# Patient Record
Sex: Male | Born: 1965 | Race: White | Hispanic: No | Marital: Married | Smoking: Former smoker
Health system: Southern US, Community
[De-identification: ages and names within clinical notes are randomized; demographics above are authoritative.]

## PROBLEM LIST (undated history)

## (undated) DIAGNOSIS — E119 Type 2 diabetes mellitus without complications: Secondary | ICD-10-CM

## (undated) DIAGNOSIS — K219 Gastro-esophageal reflux disease without esophagitis: Secondary | ICD-10-CM

## (undated) DIAGNOSIS — I10 Essential (primary) hypertension: Secondary | ICD-10-CM

## (undated) DIAGNOSIS — G4730 Sleep apnea, unspecified: Secondary | ICD-10-CM

## (undated) DIAGNOSIS — E785 Hyperlipidemia, unspecified: Secondary | ICD-10-CM

## (undated) HISTORY — PX: CHOLECYSTECTOMY: SHX55

## (undated) HISTORY — DX: Sleep apnea, unspecified: G47.30

## (undated) HISTORY — PX: HERNIA REPAIR: SHX51

---

## 2007-12-19 ENCOUNTER — Ambulatory Visit: Payer: Self-pay | Admitting: Family Medicine

## 2010-05-04 ENCOUNTER — Ambulatory Visit: Payer: Self-pay | Admitting: Internal Medicine

## 2011-06-17 DIAGNOSIS — K219 Gastro-esophageal reflux disease without esophagitis: Secondary | ICD-10-CM | POA: Insufficient documentation

## 2011-06-17 DIAGNOSIS — F4310 Post-traumatic stress disorder, unspecified: Secondary | ICD-10-CM | POA: Insufficient documentation

## 2013-07-06 DIAGNOSIS — E7800 Pure hypercholesterolemia, unspecified: Secondary | ICD-10-CM | POA: Insufficient documentation

## 2013-07-06 DIAGNOSIS — E119 Type 2 diabetes mellitus without complications: Secondary | ICD-10-CM | POA: Insufficient documentation

## 2013-07-28 ENCOUNTER — Ambulatory Visit: Payer: Self-pay | Admitting: Family Medicine

## 2013-08-03 ENCOUNTER — Ambulatory Visit: Payer: Self-pay | Admitting: Family Medicine

## 2013-10-10 ENCOUNTER — Ambulatory Visit: Payer: Self-pay | Admitting: Internal Medicine

## 2013-11-02 ENCOUNTER — Ambulatory Visit: Payer: Self-pay | Admitting: Internal Medicine

## 2013-12-03 ENCOUNTER — Ambulatory Visit: Payer: Self-pay | Admitting: Internal Medicine

## 2014-05-30 DIAGNOSIS — C439 Malignant melanoma of skin, unspecified: Secondary | ICD-10-CM | POA: Insufficient documentation

## 2015-06-04 ENCOUNTER — Telehealth: Payer: Self-pay | Admitting: Gastroenterology

## 2015-06-04 NOTE — Telephone Encounter (Signed)
colonoscopy

## 2015-06-15 ENCOUNTER — Other Ambulatory Visit: Payer: Self-pay

## 2015-06-15 DIAGNOSIS — I10 Essential (primary) hypertension: Secondary | ICD-10-CM | POA: Insufficient documentation

## 2015-06-15 NOTE — Telephone Encounter (Signed)
Tried contacting pt and phone number had been disconnected. No emergency number to call pt. Mailed letter requesting pt to call to schedule colonoscopy.

## 2017-03-24 ENCOUNTER — Telehealth: Payer: Self-pay | Admitting: Gastroenterology

## 2017-03-24 NOTE — Telephone Encounter (Signed)
Patient needs to schedule his colonoscopy. He has been called. Call home then cell please

## 2017-03-31 ENCOUNTER — Other Ambulatory Visit: Payer: Self-pay

## 2017-03-31 DIAGNOSIS — Z1211 Encounter for screening for malignant neoplasm of colon: Secondary | ICD-10-CM

## 2017-03-31 DIAGNOSIS — Z1212 Encounter for screening for malignant neoplasm of rectum: Principal | ICD-10-CM

## 2017-03-31 NOTE — Progress Notes (Signed)
Gastroenterology Pre-Procedure Review  Request Date: 12/10 Requesting Physician: Dr. Allen Norris  PATIENT REVIEW QUESTIONS: The patient responded to the following health history questions as indicated:    1. Are you having any GI issues? no 2. Do you have a personal history of Polyps? no 3. Do you have a family history of Colon Cancer or Polyps? no 4. Diabetes Mellitus? yes (Type II) 5. Joint replacements in the past 12 months?no 6. Major health problems in the past 3 months?no 7. Any artificial heart valves, MVP, or defibrillator?no    MEDICATIONS & ALLERGIES:    Patient reports the following regarding taking any anticoagulation/antiplatelet therapy:   Plavix, Coumadin, Eliquis, Xarelto, Lovenox, Pradaxa, Brilinta, or Effient? no Aspirin? yes (81mg )  Patient confirms/reports the following medications:  Current Outpatient Medications  Medication Sig Dispense Refill  . atorvastatin (LIPITOR) 40 MG tablet TK 1 T PO QD  3  . lisinopril (PRINIVIL,ZESTRIL) 10 MG tablet   11  . metFORMIN (GLUCOPHAGE) 500 MG tablet TK 1 T PO BID WITH MEALS  2  . Multiple Vitamin (MULTIVITAMIN) capsule Take by mouth.    . pantoprazole (PROTONIX) 40 MG tablet TK 1 T PO  DAILY  3  . PARoxetine (PAXIL) 20 MG tablet TK 1 T PO QD  3   No current facility-administered medications for this visit.     Patient confirms/reports the following allergies:  Not on File  No orders of the defined types were placed in this encounter.   AUTHORIZATION INFORMATION Primary Insurance: 1D#: Group #:  Secondary Insurance: 1D#: Group #:  SCHEDULE INFORMATION: Date: 12/10 Time: Location: [place]

## 2017-04-07 ENCOUNTER — Encounter: Payer: Self-pay | Admitting: *Deleted

## 2017-04-07 ENCOUNTER — Other Ambulatory Visit: Payer: Self-pay

## 2017-04-17 NOTE — Discharge Instructions (Signed)
General Anesthesia, Adult, Care After °These instructions provide you with information about caring for yourself after your procedure. Your health care provider may also give you more specific instructions. Your treatment has been planned according to current medical practices, but problems sometimes occur. Call your health care provider if you have any problems or questions after your procedure. °What can I expect after the procedure? °After the procedure, it is common to have: °· Vomiting. °· A sore throat. °· Mental slowness. ° °It is common to feel: °· Nauseous. °· Cold or shivery. °· Sleepy. °· Tired. °· Sore or achy, even in parts of your body where you did not have surgery. ° °Follow these instructions at home: °For at least 24 hours after the procedure: °· Do not: °? Participate in activities where you could fall or become injured. °? Drive. °? Use heavy machinery. °? Drink alcohol. °? Take sleeping pills or medicines that cause drowsiness. °? Make important decisions or sign legal documents. °? Take care of children on your own. °· Rest. °Eating and drinking °· If you vomit, drink water, juice, or soup when you can drink without vomiting. °· Drink enough fluid to keep your urine clear or pale yellow. °· Make sure you have little or no nausea before eating solid foods. °· Follow the diet recommended by your health care provider. °General instructions °· Have a responsible adult stay with you until you are awake and alert. °· Return to your normal activities as told by your health care provider. Ask your health care provider what activities are safe for you. °· Take over-the-counter and prescription medicines only as told by your health care provider. °· If you smoke, do not smoke without supervision. °· Keep all follow-up visits as told by your health care provider. This is important. °Contact a health care provider if: °· You continue to have nausea or vomiting at home, and medicines are not helpful. °· You  cannot drink fluids or start eating again. °· You cannot urinate after 8-12 hours. °· You develop a skin rash. °· You have fever. °· You have increasing redness at the site of your procedure. °Get help right away if: °· You have difficulty breathing. °· You have chest pain. °· You have unexpected bleeding. °· You feel that you are having a life-threatening or urgent problem. °This information is not intended to replace advice given to you by your health care provider. Make sure you discuss any questions you have with your health care provider. °Document Released: 07/28/2000 Document Revised: 09/24/2015 Document Reviewed: 04/05/2015 °Elsevier Interactive Patient Education © 2018 [employer] Inc[employer] ° °

## 2017-04-20 ENCOUNTER — Ambulatory Visit: Payer: BC Managed Care – PPO | Admitting: Anesthesiology

## 2017-04-20 ENCOUNTER — Ambulatory Visit
Admission: RE | Disposition: A | Discharge status: Home / Self Care | Payer: Self-pay | Source: Ambulatory Visit | Attending: Gastroenterology

## 2017-04-20 ENCOUNTER — Ambulatory Visit
Admission: RE | Admit: 2017-04-20 | Discharge: 2017-04-20 | Disposition: A | Discharge status: Home / Self Care | Payer: BC Managed Care – PPO | Source: Ambulatory Visit | Attending: Gastroenterology | Admitting: Gastroenterology

## 2017-04-20 DIAGNOSIS — Z1211 Encounter for screening for malignant neoplasm of colon: Secondary | ICD-10-CM

## 2017-04-20 DIAGNOSIS — E119 Type 2 diabetes mellitus without complications: Secondary | ICD-10-CM | POA: Insufficient documentation

## 2017-04-20 DIAGNOSIS — Z87891 Personal history of nicotine dependence: Secondary | ICD-10-CM | POA: Diagnosis not present

## 2017-04-20 DIAGNOSIS — Z7982 Long term (current) use of aspirin: Secondary | ICD-10-CM | POA: Insufficient documentation

## 2017-04-20 DIAGNOSIS — K219 Gastro-esophageal reflux disease without esophagitis: Secondary | ICD-10-CM | POA: Insufficient documentation

## 2017-04-20 DIAGNOSIS — Z79899 Other long term (current) drug therapy: Secondary | ICD-10-CM | POA: Insufficient documentation

## 2017-04-20 DIAGNOSIS — Z7984 Long term (current) use of oral hypoglycemic drugs: Secondary | ICD-10-CM | POA: Diagnosis not present

## 2017-04-20 DIAGNOSIS — K641 Second degree hemorrhoids: Secondary | ICD-10-CM | POA: Insufficient documentation

## 2017-04-20 DIAGNOSIS — I10 Essential (primary) hypertension: Secondary | ICD-10-CM | POA: Insufficient documentation

## 2017-04-20 DIAGNOSIS — E785 Hyperlipidemia, unspecified: Secondary | ICD-10-CM | POA: Diagnosis not present

## 2017-04-20 DIAGNOSIS — Z6836 Body mass index (BMI) 36.0-36.9, adult: Secondary | ICD-10-CM | POA: Insufficient documentation

## 2017-04-20 HISTORY — DX: Essential (primary) hypertension: I10

## 2017-04-20 HISTORY — DX: Gastro-esophageal reflux disease without esophagitis: K21.9

## 2017-04-20 HISTORY — DX: Hyperlipidemia, unspecified: E78.5

## 2017-04-20 HISTORY — DX: Type 2 diabetes mellitus without complications: E11.9

## 2017-04-20 HISTORY — PX: COLONOSCOPY WITH PROPOFOL: SHX5780

## 2017-04-20 LAB — GLUCOSE, CAPILLARY
GLUCOSE-CAPILLARY: 123 mg/dL — AB (ref 65–99)
GLUCOSE-CAPILLARY: 113 mg/dL — AB (ref 65–99)

## 2017-04-20 SURGERY — COLONOSCOPY WITH PROPOFOL
Anesthesia: General | Wound class: Contaminated

## 2017-04-20 MED ORDER — PROPOFOL 10 MG/ML IV BOLUS
INTRAVENOUS | Status: DC | PRN
  Administered 2017-04-20 (×3): 20 mg via INTRAVENOUS
  Administered 2017-04-20: 80 mg via INTRAVENOUS
  Administered 2017-04-20 (×4): 20 mg via INTRAVENOUS

## 2017-04-20 MED ORDER — LACTATED RINGERS IV SOLN
INTRAVENOUS | Status: DC
  Administered 2017-04-20: 08:00:00 via INTRAVENOUS

## 2017-04-20 MED ORDER — OXYCODONE HCL 5 MG PO TABS: 5.0000 mg | ORAL_TABLET | Freq: Once | ORAL | Status: DC | PRN

## 2017-04-20 MED ORDER — STERILE WATER FOR IRRIGATION IR SOLN
Status: DC | PRN
  Administered 2017-04-20: 08:00:00

## 2017-04-20 MED ORDER — LIDOCAINE HCL (CARDIAC) 20 MG/ML IV SOLN
INTRAVENOUS | Status: DC | PRN
  Administered 2017-04-20: 40 mg via INTRAVENOUS

## 2017-04-20 MED ORDER — LACTATED RINGERS IV SOLN: 500.0000 mL | INTRAVENOUS | Status: DC

## 2017-04-20 MED ORDER — OXYCODONE HCL 5 MG/5ML PO SOLN: 5.0000 mg | Freq: Once | ORAL | Status: DC | PRN

## 2017-04-20 SURGICAL SUPPLY — 23 items

## 2017-04-20 NOTE — Transfer of Care (Signed)
Immediate Anesthesia Transfer of Care Note  Patient: Phillip Alexander  Procedure(s) Performed: COLONOSCOPY WITH PROPOFOL (N/A )  Patient Location: PACU  Anesthesia Type: General  Level of Consciousness: awake, alert  and patient cooperative  Airway and Oxygen Therapy: Patient Spontanous Breathing and Patient connected to supplemental oxygen  Post-op Assessment: Post-op Vital signs reviewed, Patient's Cardiovascular Status Stable, Respiratory Function Stable, Patent Airway and No signs of Nausea or vomiting  Post-op Vital Signs: Reviewed and stable  Complications: No apparent anesthesia complications

## 2017-04-20 NOTE — H&P (Signed)
   Lucilla Lame, MD [address] [place], [place] [address] Phone: [phone] Fax : [phone]  Primary Care Physician:  Hortencia Pilar, MD Primary Gastroenterologist:  Dr. Allen Norris  Pre-Procedure History & Physical: HPI:  Phillip Alexander is a 51 y.o. male is here for a screening colonoscopy.   Past Medical History:  Diagnosis Date  . Diabetes mellitus, type 2 ([place])   . GERD (gastroesophageal reflux disease)   . Hyperlipidemia   . Hypertension     Past Surgical History:  Procedure Laterality Date  . CHOLECYSTECTOMY    . HERNIA REPAIR      Prior to Admission medications   Medication Sig Start Date End Date Taking? Authorizing Provider  aspirin 81 MG tablet Take 81 mg by mouth daily.   Yes [provider]  atorvastatin (LIPITOR) 40 MG tablet TK 1 T PO QD 03/15/15  Yes [provider]  lisinopril (PRINIVIL,ZESTRIL) 10 MG tablet  05/31/15  Yes [provider]  metFORMIN (GLUCOPHAGE) 500 MG tablet TK 1 T PO BID WITH MEALS 03/15/15  Yes [provider]  Multiple Vitamin (MULTIVITAMIN) capsule Take by mouth.   Yes [provider]  pantoprazole (PROTONIX) 40 MG tablet TK 1 T PO  DAILY 03/27/15  Yes [provider]  PARoxetine (PAXIL) 20 MG tablet TK 1 T PO QD 03/15/15  Yes [provider]    Allergies as of 03/31/2017  . (Not on File)    History reviewed. No pertinent family history.  Social History   Socioeconomic History  . Marital status: Married    Spouse name: Not on file  . Number of children: Not on file  . Years of education: Not on file  . Highest education level: Not on file  Social Needs  . Financial resource strain: Not on file  . Food insecurity - worry: Not on file  . Food insecurity - inability: Not on file  . Transportation needs - medical: Not on file  . Transportation needs - non-medical: Not on file  Occupational History  . Not on file  Tobacco Use  . Smoking status: Former  Smoker    Types: Cigarettes    Last attempt to quit: 2007    Years since quitting: 11.9  . Smokeless tobacco: Never Used  Substance and Sexual Activity  . Alcohol use: Yes    Comment: rare - Holidays  . Drug use: Not on file  . Sexual activity: Not on file  Other Topics Concern  . Not on file  Social History Narrative  . Not on file    Review of Systems: See HPI, otherwise negative ROS  Physical Exam: BP 132/86   Pulse 96   Ht 5\' 10"  (1.778 m)   Wt 253 lb (114.8 kg)   SpO2 98%   BMI 36.30 kg/m  General:   Alert,  pleasant and cooperative in NAD Head:  Normocephalic and atraumatic. Neck:  Supple; no masses or thyromegaly. Lungs:  Clear throughout to auscultation.    Heart:  Regular rate and rhythm. Abdomen:  Soft, nontender and nondistended. Normal bowel sounds, without guarding, and without rebound.   Neurologic:  Alert and  oriented x4;  grossly normal neurologically.  Impression/Plan: Phillip Alexander is now here to undergo a screening colonoscopy.  Risks, benefits, and alternatives regarding colonoscopy have been reviewed with the patient.  Questions have been answered.  All parties agreeable.

## 2017-04-20 NOTE — Anesthesia Preprocedure Evaluation (Signed)
Anesthesia Evaluation  Patient identified by MRN, date of birth, ID band  Reviewed: NPO status   History of Anesthesia Complications Negative for: history of anesthetic complications  Airway Mallampati: II  TM Distance: >3 FB Neck ROM: full    Dental no notable dental hx.    Pulmonary neg pulmonary ROS, former smoker,    Pulmonary exam normal        Cardiovascular Exercise Tolerance: Good hypertension, Normal cardiovascular exam     Neuro/Psych Anxiety negative neurological ROS  negative psych ROS   GI/Hepatic Neg liver ROS, GERD  Controlled,  Endo/Other  diabetesMorbid obesity (bmi=36)  Renal/GU negative Renal ROS  negative genitourinary   Musculoskeletal   Abdominal   Peds  Hematology negative hematology ROS (+)   Anesthesia Other Findings Tiva;  R eye/face skin cancer resection (1 week ago).  Reproductive/Obstetrics                             Anesthesia Physical Anesthesia Plan  ASA: II  Anesthesia Plan: General   Post-op Pain Management:    Induction:   PONV Risk Score and Plan:   Airway Management Planned:   Additional Equipment:   Intra-op Plan:   Post-operative Plan:   Informed Consent: I have reviewed the patients History and Physical, chart, labs and discussed the procedure including the risks, benefits and alternatives for the proposed anesthesia with the patient or authorized representative who has indicated his/her understanding and acceptance.     Plan Discussed with: CRNA  Anesthesia Plan Comments:         Anesthesia Quick Evaluation

## 2017-04-20 NOTE — Op Note (Signed)
[facility] Gastroenterology Patient Name: Phillip Alexander Procedure Date: 04/20/2017 7:41 AM MRN: [id number] Account #: [id number] Date of Birth: Sep 05, 1965 Admit Type: Outpatient Age: 51 Room: [facility] OR ROOM 01 Gender: Male Note Status: Finalized Procedure:            Colonoscopy Indications:          Screening for colorectal malignant neoplasm Providers:            Lucilla Lame MD, MD Referring MD:         Kerin Perna MD, MD (Referring MD) Medicines:            Propofol per Anesthesia Complications:        No immediate complications. Procedure:            Pre-Anesthesia Assessment:                       - Prior to the procedure, a History and Physical was                        performed, and patient medications and allergies were                        reviewed. The patient's tolerance of previous                        anesthesia was also reviewed. The risks and benefits of                        the procedure and the sedation options and risks were                        discussed with the patient. All questions were                        answered, and informed consent was obtained. Prior                        Anticoagulants: The patient has taken no previous                        anticoagulant or antiplatelet agents. ASA Grade                        Assessment: II - A patient with mild systemic disease.                        After reviewing the risks and benefits, the patient was                        deemed in satisfactory condition to undergo the                        procedure.                       After obtaining informed consent, the colonoscope was                        passed under direct vision. Throughout the procedure,  the patient's blood pressure, pulse, and oxygen                        saturations were monitored continuously. The Olympus                        CF-HQ190L Colonoscope (S#. [id number]) was introduced                       through the anus and advanced to the the cecum,                        identified by appendiceal orifice and ileocecal valve.                        The colonoscopy was performed without difficulty. The                        patient tolerated the procedure well. The quality of                        the bowel preparation was fair. Findings:      The perianal and digital rectal examinations were normal.      Non-bleeding internal hemorrhoids were found during retroflexion. The       hemorrhoids were Grade II (internal hemorrhoids that prolapse but reduce       spontaneously). Impression:           - Preparation of the colon was fair.                       - Non-bleeding internal hemorrhoids.                       - No specimens collected. Recommendation:       - Discharge patient to home.                       - Resume previous diet.                       - Continue present medications.                       - Repeat colonoscopy in 10 years for screening unless                        any change in family history or lower GI problems. Procedure Code(s):    --- Professional ---                       [phone], Colonoscopy, flexible; diagnostic, including                        collection of specimen(s) by brushing or washing, when                        performed (separate procedure) Diagnosis Code(s):    --- Professional ---                       Z12.11, Encounter for screening for malignant neoplasm  of colon CPT copyright 2016 American Medical Association. All rights reserved. The codes documented in this report are preliminary and upon coder review may  be revised to meet current compliance requirements. Lucilla Lame MD, MD 04/20/2017 8:33:52 AM This report has been signed electronically. Number of Addenda: 0 Note Initiated On: 04/20/2017 7:41 AM Scope Withdrawal Time: 0 hours 10 minutes 15 seconds  Total Procedure Duration: 0 hours 13 minutes  9 seconds       [facility]

## 2017-04-20 NOTE — Anesthesia Postprocedure Evaluation (Signed)
Anesthesia Post Note  Patient: Phillip Alexander  Procedure(s) Performed: COLONOSCOPY WITH PROPOFOL (N/A )  Patient location during evaluation: PACU Anesthesia Type: General Level of consciousness: awake and alert Pain management: pain level controlled Vital Signs Assessment: post-procedure vital signs reviewed and stable Respiratory status: spontaneous breathing, nonlabored ventilation, respiratory function stable and patient connected to nasal cannula oxygen Cardiovascular status: blood pressure returned to baseline and stable Postop Assessment: no apparent nausea or vomiting Anesthetic complications: no    Ziggy Reveles

## 2017-04-20 NOTE — Anesthesia Procedure Notes (Signed)
Performed by: Decorey Wahlert, CRNA Pre-anesthesia Checklist: Patient identified, Emergency Drugs available, Suction available, Timeout performed and Patient being monitored Patient Re-evaluated:Patient Re-evaluated prior to induction Oxygen Delivery Method: Nasal cannula Placement Confirmation: positive ETCO2       

## 2017-04-21 ENCOUNTER — Encounter: Payer: Self-pay | Admitting: Gastroenterology

## 2017-07-20 ENCOUNTER — Ambulatory Visit
Admission: RE | Admit: 2017-07-20 | Discharge: 2017-07-20 | Disposition: A | Discharge status: Home / Self Care | Payer: No Typology Code available for payment source | Source: Ambulatory Visit | Attending: Occupational Medicine | Admitting: Occupational Medicine

## 2017-07-20 ENCOUNTER — Other Ambulatory Visit: Payer: Self-pay | Admitting: Occupational Medicine

## 2017-07-20 DIAGNOSIS — Z0289 Encounter for other administrative examinations: Secondary | ICD-10-CM

## 2017-11-18 ENCOUNTER — Other Ambulatory Visit: Payer: Self-pay | Admitting: Family Medicine

## 2017-11-18 DIAGNOSIS — R7989 Other specified abnormal findings of blood chemistry: Secondary | ICD-10-CM

## 2017-11-18 DIAGNOSIS — R945 Abnormal results of liver function studies: Principal | ICD-10-CM

## 2018-01-18 ENCOUNTER — Ambulatory Visit
Admission: RE | Admit: 2018-01-18 | Discharge: 2018-01-18 | Disposition: A | Discharge status: Home / Self Care | Payer: BC Managed Care – PPO | Source: Ambulatory Visit | Attending: Family Medicine | Admitting: Family Medicine

## 2018-01-18 DIAGNOSIS — R7989 Other specified abnormal findings of blood chemistry: Secondary | ICD-10-CM | POA: Insufficient documentation

## 2018-01-18 DIAGNOSIS — Z9049 Acquired absence of other specified parts of digestive tract: Secondary | ICD-10-CM | POA: Diagnosis not present

## 2018-01-18 DIAGNOSIS — K760 Fatty (change of) liver, not elsewhere classified: Secondary | ICD-10-CM | POA: Diagnosis not present

## 2018-01-18 DIAGNOSIS — R945 Abnormal results of liver function studies: Secondary | ICD-10-CM

## 2018-12-02 ENCOUNTER — Other Ambulatory Visit: Payer: Self-pay

## 2018-12-02 ENCOUNTER — Ambulatory Visit (INDEPENDENT_AMBULATORY_CARE_PROVIDER_SITE_OTHER): Payer: Commercial Managed Care - PPO | Admitting: Internal Medicine

## 2018-12-02 ENCOUNTER — Encounter: Payer: Self-pay | Admitting: Internal Medicine

## 2018-12-02 VITALS — BP 133/86 | HR 106 | Resp 16 | Ht 70.0 in | Wt 252.0 lb

## 2018-12-02 DIAGNOSIS — R0683 Snoring: Secondary | ICD-10-CM

## 2018-12-02 DIAGNOSIS — K219 Gastro-esophageal reflux disease without esophagitis: Secondary | ICD-10-CM

## 2018-12-02 DIAGNOSIS — G4719 Other hypersomnia: Secondary | ICD-10-CM
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
## 2018-12-09 ENCOUNTER — Encounter: Payer: Self-pay | Admitting: Internal Medicine

## 2018-12-09 ENCOUNTER — Telehealth: Payer: Self-pay

## 2018-12-09 NOTE — Telephone Encounter (Signed)
Left message and asked pt to call and schedule his sleep study, I have benefits and authorization covered at 100%. Beth

## 2018-12-29 ENCOUNTER — Ambulatory Visit (INDEPENDENT_AMBULATORY_CARE_PROVIDER_SITE_OTHER): Payer: Commercial Managed Care - PPO | Admitting: Internal Medicine

## 2018-12-29 DIAGNOSIS — G4733 Obstructive sleep apnea (adult) (pediatric): Secondary | ICD-10-CM | POA: Diagnosis not present

## 2018-12-30 ENCOUNTER — Other Ambulatory Visit: Payer: Self-pay

## 2019-01-11 ENCOUNTER — Other Ambulatory Visit: Payer: Self-pay

## 2019-01-11 ENCOUNTER — Encounter: Payer: Self-pay | Admitting: Internal Medicine

## 2019-01-11 ENCOUNTER — Ambulatory Visit (INDEPENDENT_AMBULATORY_CARE_PROVIDER_SITE_OTHER): Payer: Commercial Managed Care - PPO | Admitting: Internal Medicine

## 2019-01-11 VITALS — BP 118/86 | HR 99 | Resp 16 | Ht 70.0 in | Wt 241.0 lb

## 2019-01-11 DIAGNOSIS — G4719 Other hypersomnia: Secondary | ICD-10-CM | POA: Diagnosis not present

## 2019-01-11 DIAGNOSIS — G4733 Obstructive sleep apnea (adult) (pediatric): Secondary | ICD-10-CM

## 2019-01-11 DIAGNOSIS — K219 Gastro-esophageal reflux disease without esophagitis: Secondary | ICD-10-CM

## 2019-01-11 NOTE — Progress Notes (Signed)
[facility] [place], [place] [address]  Pulmonary Sleep Medicine   Office Visit Note  Patient Name: Phillip Alexander DOB: [birth year] MRN [id number]  Date of Service: 01/11/2019  Complaints/HPI: Pt is here for follow up on sleep study.  He had an overall AHI or 28/hr.  He had mild snoring, and a low spo2 of 86%.  This will likely be corrected by cpap use.  Will get titration study to assess proper setting for his machine.   ROS  General: (-) fever, (-) chills, (-) night sweats, (-) weakness Skin: (-) rashes, (-) itching,. Eyes: (-) visual changes, (-) redness, (-) itching. Nose and Sinuses: (-) nasal stuffiness or itchiness, (-) postnasal drip, (-) nosebleeds, (-) sinus trouble. Mouth and Throat: (-) sore throat, (-) hoarseness. Neck: (-) swollen glands, (-) enlarged thyroid, (-) neck pain. Respiratory: - cough, (-) bloody sputum, - shortness of breath, - wheezing. Cardiovascular: - ankle swelling, (-) chest pain. Lymphatic: (-) lymph node enlargement. Neurologic: (-) numbness, (-) tingling. Psychiatric: (-) anxiety, (-) depression   Current Medication: Outpatient Encounter Medications as of 01/11/2019  Medication Sig Note  . aspirin 81 MG tablet Take 81 mg by mouth daily.   Marland Kitchen atorvastatin (LIPITOR) 40 MG tablet TK 1 T PO QD 06/15/2015: Received from: External Pharmacy  . lisinopril (PRINIVIL,ZESTRIL) 10 MG tablet  06/15/2015: Received from: External Pharmacy  . metFORMIN (GLUCOPHAGE) 500 MG tablet TK 1 T PO BID WITH MEALS 06/15/2015: Received from: External Pharmacy  . Multiple Vitamin (MULTIVITAMIN) capsule Take by mouth. 06/15/2015: Received from: [place]  . pantoprazole (PROTONIX) 40 MG tablet TK 1 T PO  DAILY 06/15/2015: Received from: External Pharmacy  . PARoxetine (PAXIL) 20 MG tablet TK 1 T PO QD 06/15/2015: Received from: External Pharmacy   No facility-administered encounter medications on file as of 01/11/2019.     Surgical  History: Past Surgical History:  Procedure Laterality Date  . CHOLECYSTECTOMY    . COLONOSCOPY WITH PROPOFOL N/A 04/20/2017   Procedure: COLONOSCOPY WITH PROPOFOL;  Surgeon: Lucilla Lame, MD;  Location: [place];  Service: Endoscopy;  Laterality: N/A;  Diabetic - oral meds  . HERNIA REPAIR      Medical History: Past Medical History:  Diagnosis Date  . Diabetes mellitus, type 2 ([place])   . GERD (gastroesophageal reflux disease)   . Hyperlipidemia   . Hypertension     Family History: History reviewed. No pertinent family history.  Social History: Social History   Socioeconomic History  . Marital status: Married    Spouse name: Not on file  . Number of children: Not on file  . Years of education: Not on file  . Highest education level: Not on file  Occupational History  . Not on file  Social Needs  . Financial resource strain: Not on file  . Food insecurity    Worry: Not on file    Inability: Not on file  . Transportation needs    Medical: Not on file    Non-medical: Not on file  Tobacco Use  . Smoking status: Former Smoker    Types: Cigarettes    Quit date: 2007    Years since quitting: 13.6  . Smokeless tobacco: Never Used  Substance and Sexual Activity  . Alcohol use: Yes    Comment: rare - Holidays  . Drug use: Not on file  . Sexual activity: Not on file  Lifestyle  . Physical activity    Days per week: Not on  file    Minutes per session: Not on file  . Stress: Not on file  Relationships  . Social Herbalist on phone: Not on file    Gets together: Not on file    Attends religious service: Not on file    Active member of club or organization: Not on file    Attends meetings of clubs or organizations: Not on file    Relationship status: Not on file  . Intimate partner violence    Fear of current or ex partner: Not on file    Emotionally abused: Not on file    Physically abused: Not on file    Forced sexual activity: Not on file   Other Topics Concern  . Not on file  Social History Narrative  . Not on file    Vital Signs: Blood pressure 118/86, pulse 99, resp. rate 16, height 5\' 10"  (1.778 m), weight 241 lb (109.3 kg), SpO2 95 %.  Examination: General Appearance: The patient is well-developed, well-nourished, and in no distress. Skin: Gross inspection of skin unremarkable. Head: normocephalic, no gross deformities. Eyes: no gross deformities noted. ENT: ears appear grossly normal no exudates. Neck: Supple. No thyromegaly. No LAD. Respiratory: clear bilateraly. Cardiovascular: Normal S1 and S2 without murmur or rub. Extremities: No cyanosis. pulses are equal. Neurologic: Alert and oriented. No involuntary movements.  LABS: No results found for this or any previous visit (from the past 2160 hour(s)).  Radiology: US Abdomen Limited Ruq  Result Date: 01/18/2018 CLINICAL DATA:  Elevated LFTs EXAM: ULTRASOUND ABDOMEN LIMITED RIGHT UPPER QUADRANT COMPARISON:  None. FINDINGS: Gallbladder: Surgically removed Common bile duct: Diameter: 3.7 mm Liver: Diffuse increased echogenicity is noted consistent with fatty infiltration. No focal mass is noted. Portal vein is patent on color Doppler imaging with normal direction of blood flow towards the liver. IMPRESSION: Status post cholecystectomy. Fatty liver without acute abnormality. Electronically Signed   By: Inez Catalina M.D.   On: 01/18/2018 08:54    No results found.  No results found.    Assessment and Plan: Patient Active Problem List   Diagnosis Date Noted  . Special screening for malignant neoplasms, colon   . Essential (primary) hypertension 06/15/2015  . Malignant melanoma of skin ([place]) 05/30/2014  . Controlled type 2 diabetes mellitus without complication ([place]) [id number]  . Pure hypercholesterolemia 07/06/2013  . Acid reflux 06/17/2011  . Morbid obesity ([place]) 06/17/2011  . 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 OSA (obstructive sleep  apnea) Study ordered to evaluate for appropriate pressure needed for patient CPAP machine. - Cpap titration; Future  2. Gastroesophageal reflux disease without esophagitis Stable continue Protonix as prescribed.  3. Excessive daytime sleepiness Explained my diagnosis of OSA.  Will start Pap therapy after titration study.  General Counseling: I have discussed the findings of the evaluation and examination with Phillip Alexander.  I have also discussed any further diagnostic evaluation thatmay be needed or ordered today. Phillip Alexander verbalizes understanding of the findings of todays visit. We also reviewed his medications today and discussed drug interactions and side effects including but not limited excessive drowsiness and altered mental states. We also discussed that there is always a risk not just to him but also people around him. he has been encouraged to call the office with any questions or concerns that should arise related to todays visit.    Time spent: 15 This patient was seen by Orson Gear AGNP-C in Collaboration with Dr. Devona Konig as a part of collaborative  care agreement.   I have personally obtained a history, examined the patient, evaluated laboratory and imaging results, formulated the assessment and plan and placed orders.    Allyne Gee, MD [facility] Pulmonary and Critical Care Sleep medicine

## 2019-01-21 NOTE — Procedures (Signed)
[facility] [address] [place], [place] [address]  Patient Name: Phillip Alexander DOB: Sep 28, 1965   SLEEP STUDY INTERPRETATION  DATE OF SERVICE: December 29, 2018   SLEEP STUDY HISTORY: This patient is referred to the sleep lab for a baseline Polysomnography. Pertinent history includes a history of diagnosis of excessive daytime somnolence and snoring.  PROCEDURE: This overnight polysomnogram was performed using the Alice 5 acquisition system using the standard diagnostic protocol as outlined by the AASM. This includes 6 channels of EEG, 2 channelscannels of EOG, chin EMG, bilateral anterior tibialis EMG, nasal/oral thermister, PTAF, chest and abdominal wall movements, ECG and pulse oximetry. Apneas and Hypopneas were scored per AASM definition.  SLEEP ARCHITECHTURE: This is a baseline polysomnograph  study. The total recording time was 379.3 minutes and the patients total sleep time is noted to be 206.0 minutes. Sleep onset latency was 29.5 minutes and is prolonged.  Stage R sleep onset latency was 285.5 minutes. Sleep maintenance efficiency was 54.7% and is decreased.  Sleep staging expressed as a percentage of total sleep time demonstrated 22.3% N1, 55.8% N2 and 9.2% N3  sleep. Stage R represents 12.6% of total sleep time. This is decreased.  There were a total of 163 arousals  for an overall arousal index of 47.5 per hour of sleep. PLMS arousal are not noted. Arousals without respiratory events are  noted. This can contribute to sleep architechture disruption.  RESPIRATORY MONITORING:   Patient exhibits significant evidence of sleep disorderd breathing characterized by 1 central apneas, 56 obstructive apneas and 0 mixed apneas. There were 39 obstructive hypopneas and 6 RERAs. Most of the apneas/hypopneas were of obstructive variety. The total apnea hypopnea index (apneas and hypopneas per hour of sleep) is 28.0 respiratory events per hour and is moderately severe.  Respiratory  monitoring demonstrated severe snoring through the night. There are a total of 267 snoring episodes representing 53.5% of sleep.   Baseline oxygen saturation during wakefulness was 88% and during NREM sleep averaged 92% through the night. Arterial saturation during REM sleep was 92% through the night. There was significant  oxygen desaturation with the respiratory events. Arterial oxygen desaturation occurred of at least 4% was noted with a low saturation of 86%. The study was performed off oxygen.  CARDIAC MONITORING:   Average heart rate is 75 during sleep with a high of 112 beats per minute. Malignant arrhythmias were not noted.    IMPRESSIONS:  --This overnight polysomnogram demonstrates presence of moderately severe sleep apnea with an overall AHI 28.0 per hour. --The overall AHI was somewhat worse  during Stage R. --There were associated significant arterial oxygen desaturations noted with a lowest saturation of 86% --There was no significant PLMS noted in this study. --There is severe snoring noted throughout the study.    RECOMMENDATIONS:  --CPAP titration study is recommended in this case to determine optimal response to therapy. --Nasal decongestants and antihistamines may be of help for increased upper airways resistance when present. --Weight loss through dietary and lifestyle modification is recommended in the presence of obesity. --A search for and treatment of any underlying cardiopulmonary disease is      recommended in the presence of oxygen desaturations. --Alternative treatment options if the patient is not willing to use CPAP include oral   appliances as well as surgical intervention which may help in the appropriate patient. --Clinical correlation is recommended. Please feel free to call the office for any further  questions or assistance in the care of this  patient.     Allyne Gee, MD [facility] Pulmonary Critical Care Medicine Sleep medicine

## 2019-01-26 ENCOUNTER — Ambulatory Visit (INDEPENDENT_AMBULATORY_CARE_PROVIDER_SITE_OTHER): Payer: Commercial Managed Care - PPO | Admitting: Internal Medicine

## 2019-01-26 DIAGNOSIS — G4733 Obstructive sleep apnea (adult) (pediatric): Secondary | ICD-10-CM | POA: Diagnosis not present

## 2019-01-27 ENCOUNTER — Other Ambulatory Visit: Payer: Self-pay

## 2019-02-15 ENCOUNTER — Ambulatory Visit: Payer: Commercial Managed Care - PPO | Admitting: Internal Medicine

## 2019-02-16 ENCOUNTER — Ambulatory Visit (INDEPENDENT_AMBULATORY_CARE_PROVIDER_SITE_OTHER): Payer: Commercial Managed Care - PPO | Admitting: Internal Medicine

## 2019-02-16 ENCOUNTER — Other Ambulatory Visit: Payer: Self-pay

## 2019-02-16 ENCOUNTER — Encounter: Payer: Self-pay | Admitting: Internal Medicine

## 2019-02-16 VITALS — BP 115/83 | HR 93 | Resp 16 | Ht 70.0 in | Wt 240.0 lb

## 2019-02-16 DIAGNOSIS — G4733 Obstructive sleep apnea (adult) (pediatric): Secondary | ICD-10-CM

## 2019-02-16 DIAGNOSIS — K219 Gastro-esophageal reflux disease without esophagitis: Secondary | ICD-10-CM

## 2019-02-16 DIAGNOSIS — R0683 Snoring: Secondary | ICD-10-CM | POA: Diagnosis not present

## 2019-02-16 NOTE — Progress Notes (Signed)
[facility] [place], [place] [address]  Pulmonary Sleep Medicine   Office Visit Note  Patient Name: Phillip Alexander DOB: 04-Aug-1965 MRN [id number]  Date of Service: 02/17/2019  Complaints/HPI: Pt is here to review cpap titration study.  His titration study shows an optimal pressure of 10 cm h20.  His machine will be ordered at this time.   ROS  General: (-) fever, (-) chills, (-) night sweats, (-) weakness Skin: (-) rashes, (-) itching,. Eyes: (-) visual changes, (-) redness, (-) itching. Nose and Sinuses: (-) nasal stuffiness or itchiness, (-) postnasal drip, (-) nosebleeds, (-) sinus trouble. Mouth and Throat: (-) sore throat, (-) hoarseness. Neck: (-) swollen glands, (-) enlarged thyroid, (-) neck pain. Respiratory: - cough, (-) bloody sputum, - shortness of breath, - wheezing. Cardiovascular: - ankle swelling, (-) chest pain. Lymphatic: (-) lymph node enlargement. Neurologic: (-) numbness, (-) tingling. Psychiatric: (-) anxiety, (-) depression   Current Medication: Outpatient Encounter Medications as of 02/16/2019  Medication Sig Note  . aspirin 81 MG tablet Take 81 mg by mouth daily.   Marland Kitchen atorvastatin (LIPITOR) 40 MG tablet TK 1 T PO QD 06/15/2015: Received from: External Pharmacy  . lisinopril (PRINIVIL,ZESTRIL) 10 MG tablet  06/15/2015: Received from: External Pharmacy  . metFORMIN (GLUCOPHAGE) 500 MG tablet TK 1 T PO BID WITH MEALS 06/15/2015: Received from: External Pharmacy  . Multiple Vitamin (MULTIVITAMIN) capsule Take by mouth. 06/15/2015: Received from: [place]  . pantoprazole (PROTONIX) 40 MG tablet TK 1 T PO  DAILY 06/15/2015: Received from: External Pharmacy  . PARoxetine (PAXIL) 20 MG tablet TK 1 T PO QD 06/15/2015: Received from: External Pharmacy   No facility-administered encounter medications on file as of 02/16/2019.     Surgical History: Past Surgical History:  Procedure Laterality Date  . CHOLECYSTECTOMY     . COLONOSCOPY WITH PROPOFOL N/A 04/20/2017   Procedure: COLONOSCOPY WITH PROPOFOL;  Surgeon: Lucilla Lame, MD;  Location: [facility];  Service: Endoscopy;  Laterality: N/A;  Diabetic - oral meds  . HERNIA REPAIR      Medical History: Past Medical History:  Diagnosis Date  . Diabetes mellitus, type 2 ([place])   . GERD (gastroesophageal reflux disease)   . Hyperlipidemia   . Hypertension     Family History: History reviewed. No pertinent family history.  Social History: Social History   Socioeconomic History  . Marital status: Married    Spouse name: Not on file  . Number of children: Not on file  . Years of education: Not on file  . Highest education level: Not on file  Occupational History  . Not on file  Social Needs  . Financial resource strain: Not on file  . Food insecurity    Worry: Not on file    Inability: Not on file  . Transportation needs    Medical: Not on file    Non-medical: Not on file  Tobacco Use  . Smoking status: Former Smoker    Types: Cigarettes    Quit date: 2007    Years since quitting: 13.7  . Smokeless tobacco: Never Used  Substance and Sexual Activity  . Alcohol use: Yes    Comment: rare - Holidays  . Drug use: Not on file  . Sexual activity: Not on file  Lifestyle  . Physical activity    Days per week: Not on file    Minutes per session: Not on file  . Stress: Not on file  Relationships  .  Social Herbalist on phone: Not on file    Gets together: Not on file    Attends religious service: Not on file    Active member of club or organization: Not on file    Attends meetings of clubs or organizations: Not on file    Relationship status: Not on file  . Intimate partner violence    Fear of current or ex partner: Not on file    Emotionally abused: Not on file    Physically abused: Not on file    Forced sexual activity: Not on file  Other Topics Concern  . Not on file  Social History Narrative  . Not on file     Vital Signs: Blood pressure 115/83, pulse 93, resp. rate 16, height 5\' 10"  (1.778 m), weight 240 lb (108.9 kg), SpO2 96 %.  Examination: General Appearance: The patient is well-developed, well-nourished, and in no distress. Skin: Gross inspection of skin unremarkable. Head: normocephalic, no gross deformities. Eyes: no gross deformities noted. ENT: ears appear grossly normal no exudates. Neck: Supple. No thyromegaly. No LAD. Respiratory: clear bilaterally. Cardiovascular: Normal S1 and S2 without murmur or rub. Extremities: No cyanosis. pulses are equal. Neurologic: Alert and oriented. No involuntary movements.  LABS: No results found for this or any previous visit (from the past 2160 hour(s)).  Radiology: US Abdomen Limited Ruq  Result Date: 01/18/2018 CLINICAL DATA:  Elevated LFTs EXAM: ULTRASOUND ABDOMEN LIMITED RIGHT UPPER QUADRANT COMPARISON:  None. FINDINGS: Gallbladder: Surgically removed Common bile duct: Diameter: 3.7 mm Liver: Diffuse increased echogenicity is noted consistent with fatty infiltration. No focal mass is noted. Portal vein is patent on color Doppler imaging with normal direction of blood flow towards the liver. IMPRESSION: Status post cholecystectomy. Fatty liver without acute abnormality. Electronically Signed   By: Inez Catalina M.D.   On: 01/18/2018 08:54    No results found.  No results found.   Assessment and Plan: Patient Active Problem List   Diagnosis Date Noted  . Special screening for malignant neoplasms, colon   . Essential (primary) hypertension 06/15/2015  . Malignant melanoma of skin ([place]) 05/30/2014  . Controlled type 2 diabetes mellitus without complication ([place]) XX123456  . Pure hypercholesterolemia 07/06/2013  . Acid reflux 06/17/2011  . Morbid obesity ([place]) 06/17/2011  . 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 OSA (obstructive sleep apnea) Start patient on cpap therapy.  Will follow up as indicated - For home use only DME  continuous positive airway pressure (CPAP)  2. Gastroesophageal reflux disease without esophagitis Stable, continue present management.   3. Loud snoring Likely from OSA, should resolve with cpap therapy.   General Counseling: I have discussed the findings of the evaluation and examination with Phillip Alexander.  I have also discussed any further diagnostic evaluation thatmay be needed or ordered today. Phillip Alexander verbalizes understanding of the findings of todays visit. We also reviewed his medications today and discussed drug interactions and side effects including but not limited excessive drowsiness and altered mental states. We also discussed that there is always a risk not just to him but also people around him. he has been encouraged to call the office with any questions or concerns that should arise related to todays visit.    Time spent: 15 This patient was seen by Orson Gear AGNP-C in Collaboration with Dr. Devona Konig as a part of collaborative care agreement.   I have personally obtained a history, examined the patient, evaluated laboratory and imaging results, formulated the  assessment and plan and placed orders.    Allyne Gee, MD [facility] Pulmonary and Critical Care Sleep medicine

## 2019-02-17 ENCOUNTER — Telehealth: Payer: Self-pay

## 2019-02-17 NOTE — Telephone Encounter (Signed)
Gave orders RX for [facility] for new cpap machine. Beth

## 2019-03-09 ENCOUNTER — Ambulatory Visit: Payer: Commercial Managed Care - PPO

## 2019-03-10 NOTE — Procedures (Signed)
[facility] [address] [place], [place] [address]  Patient Name: Phillip Alexander DOB: [birth year]   SLEEP STUDY INTERPRETATION  DATE OF SERVICE: January 26, 2019   SLEEP STUDY HISTORY: This patient is referred to the sleep lab for a baseline Polysomnography. Pertinent history includes a history of diagnosis of excessive daytime somnolence and snoring.  PROCEDURE: This overnight polysomnogram was performed using the Alice 5 acquisition system using the standard diagnostic protocol as outlined by the AASM. This includes 6 channels of EEG, 2 channelscannels of EOG, chin EMG, bilateral anterior tibialis EMG, nasal/oral thermister, PTAF, chest and abdominal wall movements, ECG and pulse oximetry. Apneas and Hypopneas were scored per AASM definition.  SLEEP ARCHITECHTURE: This is a baseline polysomnograph  study. The total recording time was 394.1 minutes and the patients total sleep time is noted to be 287.0 minutes. Sleep onset latency was 24.5 minutes and is prolonged.  Stage R sleep onset latency was 268.5 minutes. Sleep maintenance efficiency was 72.9% and is reduced.  Sleep staging expressed as a percentage of total sleep time demonstrated 20.4% N1, 56.4% N2 and 16.7% N3  sleep. Stage R represents 6.4% of total sleep time. This is decreased.  There were a total of 105 arousals  for an overall arousal index of 22 per hour of sleep. PLMS arousal were not noted. Arousals without respiratory events are  noted. This can contribute to sleep architechture disruption.  RESPIRATORY MONITORING:   Patient exhibits some evidence of sleep disorderd breathing characterized by 0 central apneas, 18 obstructive apneas and 1 mixed apneas. There were 140 obstructive hypopneas and 0 RERAs. Most of the apneas/hypopneas were of obstructive variety. The total apnea hypopnea index (apneas and hypopneas per hour of sleep) is 33.2 respiratory events per hour and is severe.  Respiratory monitoring  demonstrated very mild snoring through the night. There are a total of 58 snoring episodes representing 1.2% of sleep.   Baseline oxygen saturation during wakefulness was 94% and during NREM sleep averaged 93% through the night. Arterial saturation during REM sleep was 96% through the night. There was significant  oxygen desaturation with the respiratory events. Arterial oxygen desaturation occurred of at least 4% was noted with a low saturation of 84%. The study was performed off oxygen.  CARDIAC MONITORING:   Average heart rate is 64 during sleep with a high of 90 beats per minute. Malignant arrhythmias were not noted.  CPAP titration: Patient was titrated on CPAP from a pressure of 6 cm water pressure up to a pressure of 10 cm water pressure.  Patient appeared to tolerate the CPAP fairly well.  On a pressure of 10 cm water pressure patient set for 137.6 minutes and exhibit stage R sleep.  There were no apneas and 20 hypopneas for a apnea hypopnea index of 5.7.  Lowest oxygen saturation was 90%.  There was no further titration beyond this level  IMPRESSIONS:  --This overnight polysomnogram demonstrates severe obstructive sleep apnea with an overall AHI 33.2 per hour. --The overall AHI was somewhat worse  during Stage R. --There were associated was arterial oxygen desaturations noted 85% --There was no significant PLMS noted in this study. --There is very mild snoring noted throughout the study.    RECOMMENDATIONS:  --CPAP titration study shows improvement after application of CPAP.  The optimal pressure in this study is 10 cm water pressure however there was still a residual index of 5.7/h.  Patient could have been titrated further beyond this pressure and therefore I  would recommend doing a auto titration at home to determine what the final optimal pressure is.. --Nasal decongestants and antihistamines may be of help for increased upper airways resistance when present. --Weight loss  through dietary and lifestyle modification is recommended in the presence of obesity. --A search for and treatment of any underlying cardiopulmonary disease is      recommended in the presence of oxygen desaturations. --Alternative treatment options if the patient is not willing to use CPAP include oral   appliances as well as surgical intervention which may help in the appropriate patient. --Clinical correlation is recommended. Please feel free to call the office for any further  questions or assistance in the care of this patient.     Allyne Gee, MD [facility] Pulmonary Critical Care Medicine Sleep medicine

## 2019-04-14 ENCOUNTER — Telehealth: Payer: Self-pay

## 2019-04-14 NOTE — Telephone Encounter (Signed)
Per Patient insurance with [employer], he is in certification process that takes 4-6 weeks, patients wife will call me around 05/19/2019 once they receive the letter and call with the authorization and then we can set patient up with new cpap. Beth

## 2019-05-25 IMAGING — US US ABDOMEN LIMITED
1 series · 14 of 25 positions shown · non-contrast
Comparison: None.

CLINICAL DATA: Elevated LFTs

EXAM:
ULTRASOUND ABDOMEN LIMITED RIGHT UPPER QUADRANT

[Series 1: us abdomen limited · 0.23mm/px · 14 of 40 slices shown]
[im 1/40]
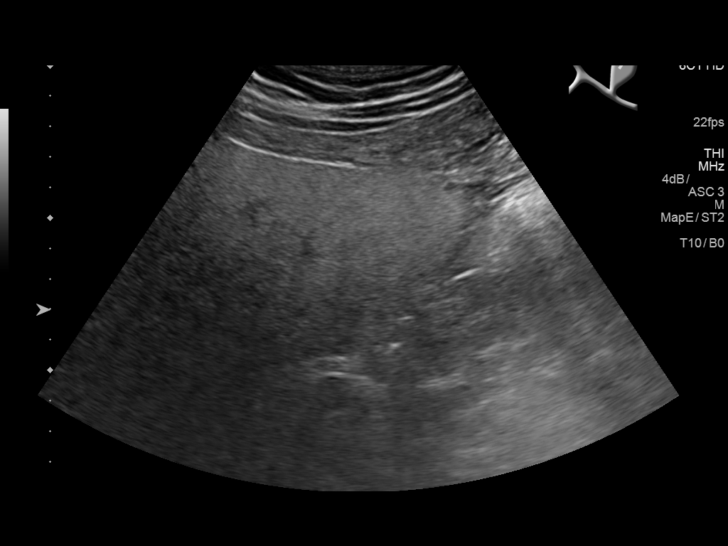
[im 4/40]
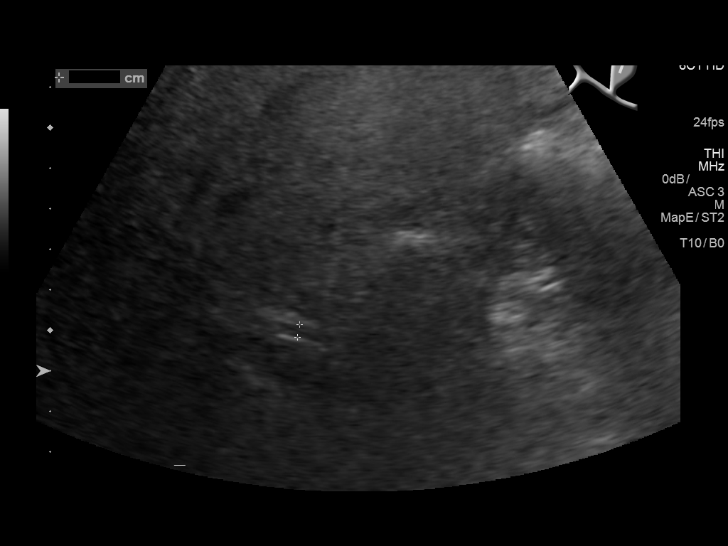
[im 7/40]
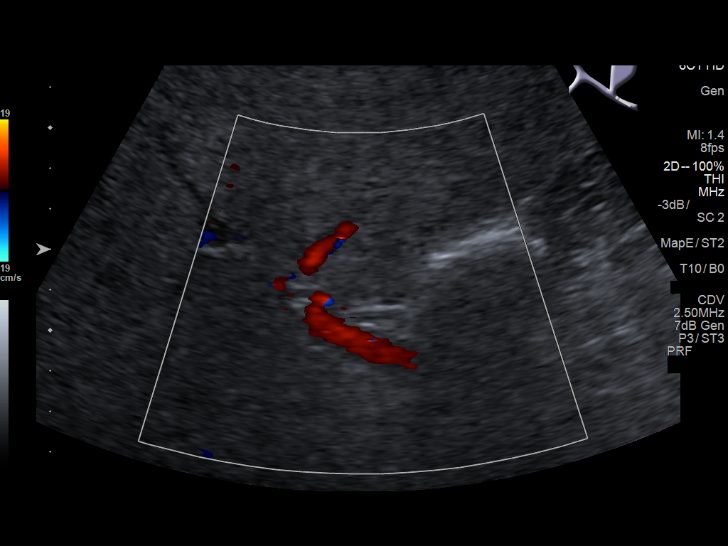
[im 10/40]
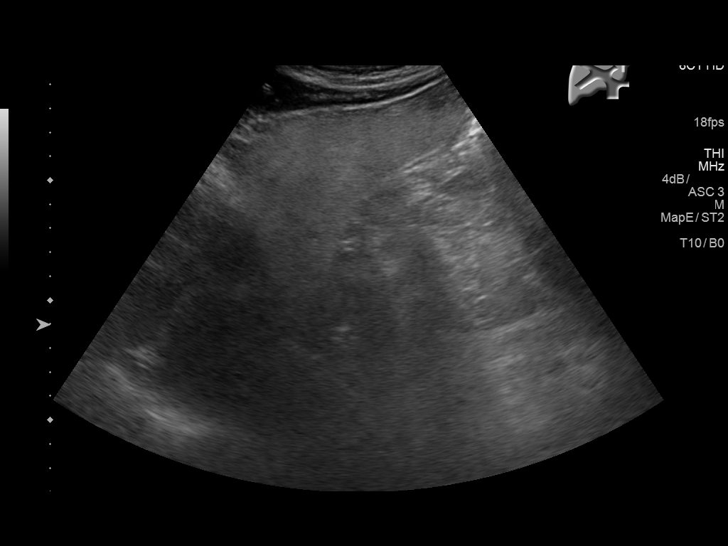
[im 14/40]
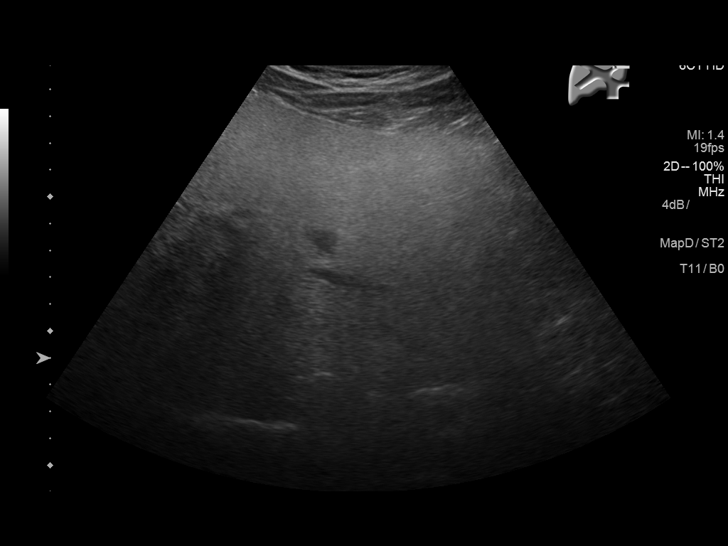
[im 15/40]
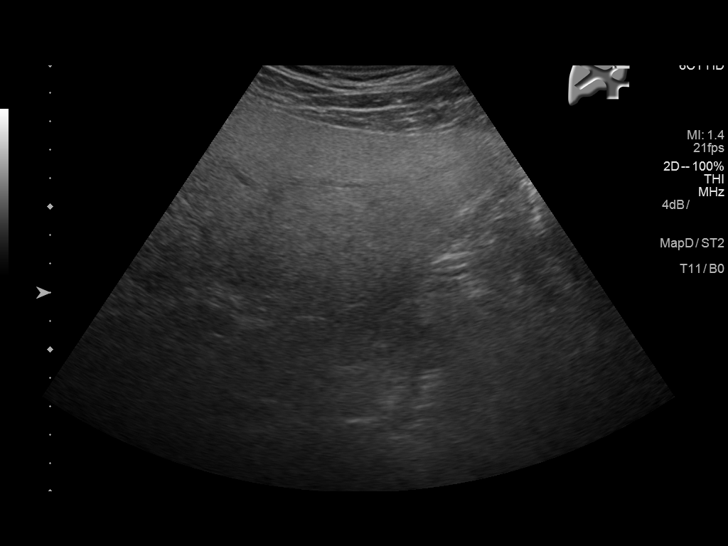
[im 18/40]
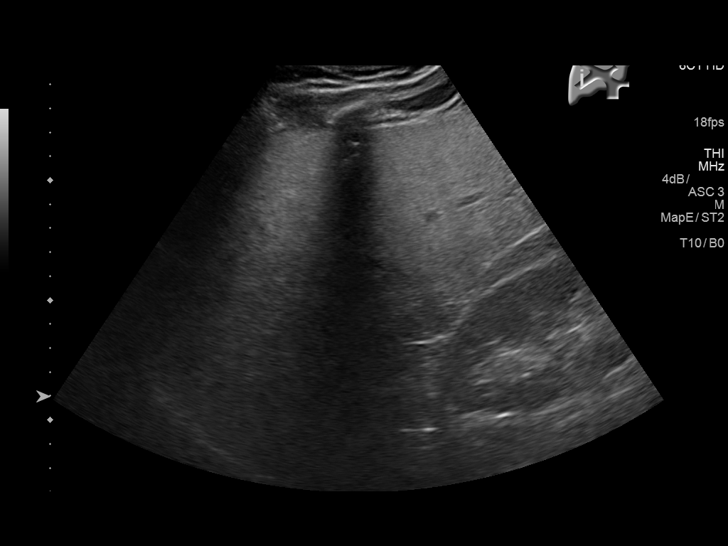
[im 22/40]
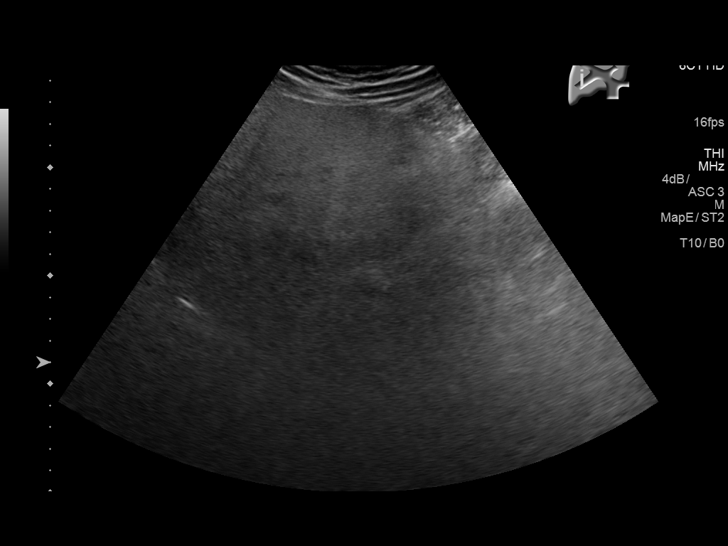
[im 25/40]
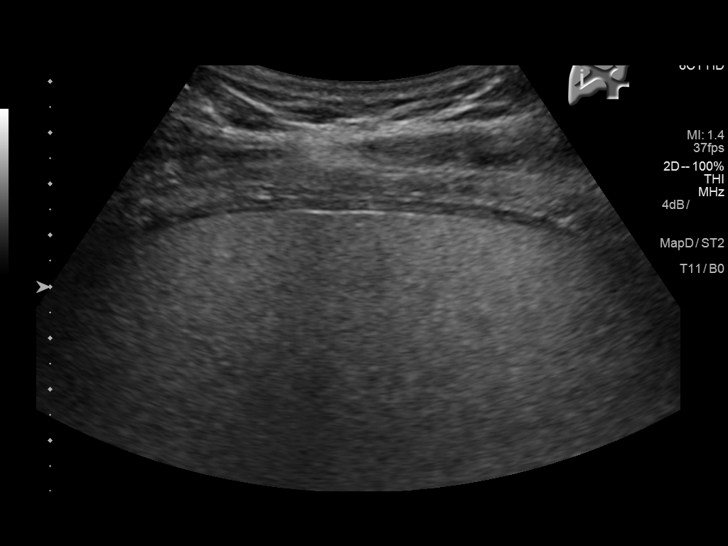
[im 27/40]
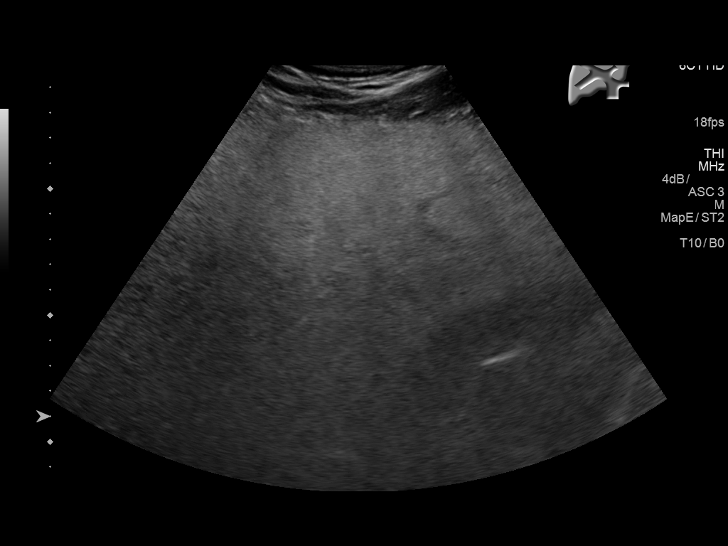
[im 30/40]
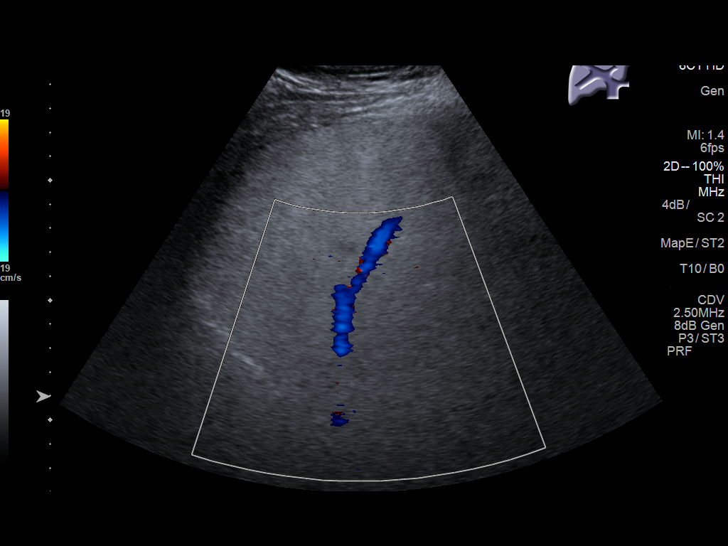
[im 33/40]
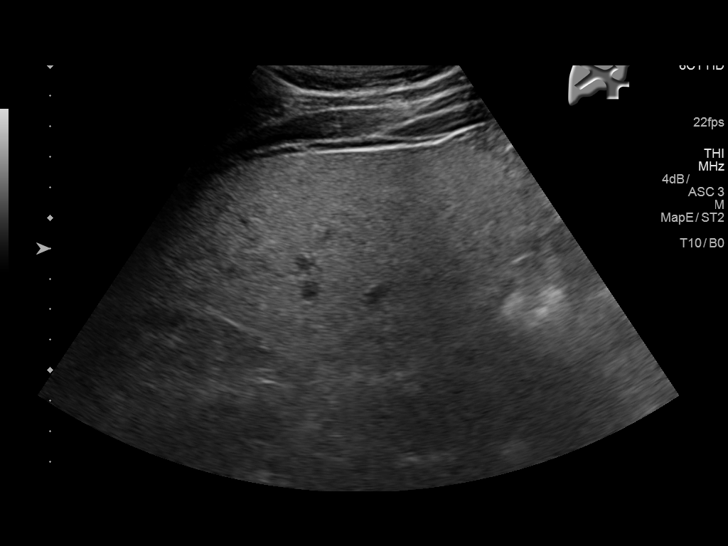
[im 36/40]
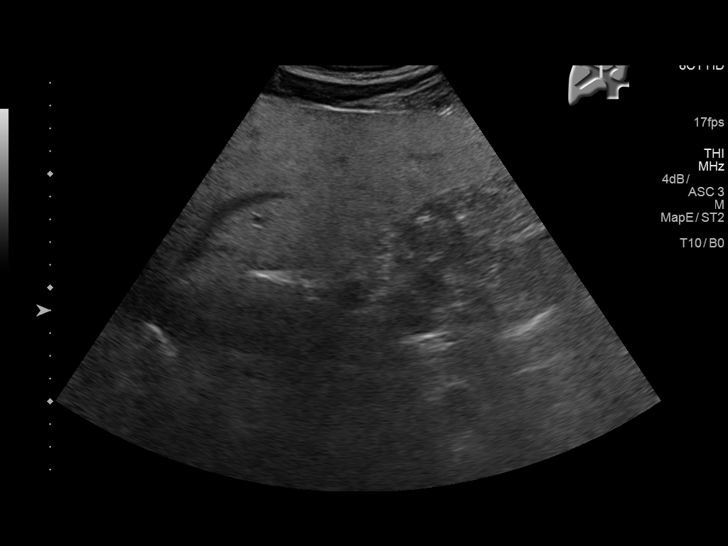
[im 40/40]
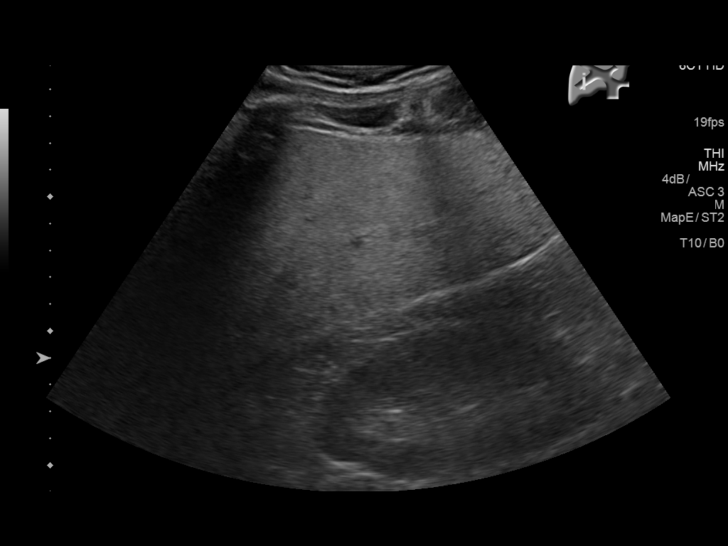

[14 of 25 positions shown; findings below may reference images not displayed]

FINDINGS: Gallbladder:

Surgically removed

Common bile duct:

Diameter: 3.7 mm

Liver:

Diffuse increased echogenicity is noted consistent with fatty
infiltration. No focal mass is noted. Portal vein is patent on color
Doppler imaging with normal direction of blood flow towards the
liver.
IMPRESSION: Status post cholecystectomy.

Fatty liver without acute abnormality.

## 2019-07-25 ENCOUNTER — Telehealth: Payer: Self-pay

## 2019-07-25 NOTE — Telephone Encounter (Signed)
Confirmed appointment on 07/27/2019 and screened for covid. klh °

## 2019-07-25 NOTE — Telephone Encounter (Signed)
Left message to confirm appt for 07/27/19

## 2019-07-25 NOTE — Telephone Encounter (Signed)
Confirmed appointment on

## 2019-07-27 ENCOUNTER — Other Ambulatory Visit: Payer: Self-pay

## 2019-07-27 ENCOUNTER — Ambulatory Visit (INDEPENDENT_AMBULATORY_CARE_PROVIDER_SITE_OTHER): Payer: Commercial Managed Care - PPO

## 2019-07-27 DIAGNOSIS — G4733 Obstructive sleep apnea (adult) (pediatric): Secondary | ICD-10-CM

## 2019-07-27 NOTE — Progress Notes (Signed)
New cpap setup  He was setup on resmed cpap at 10 cmH2o with climate line, heated humidifier, and resmed full face mask F-20 medium. He had good understanding of using, compliance and cleaning of cpap. Will follow up in sleep clinic in 4-5 weeks

## 2019-08-31 ENCOUNTER — Ambulatory Visit (INDEPENDENT_AMBULATORY_CARE_PROVIDER_SITE_OTHER): Payer: Commercial Managed Care - PPO

## 2019-08-31 ENCOUNTER — Other Ambulatory Visit: Payer: Self-pay

## 2019-08-31 DIAGNOSIS — G4733 Obstructive sleep apnea (adult) (pediatric): Secondary | ICD-10-CM | POA: Diagnosis not present

## 2019-08-31 NOTE — Progress Notes (Signed)
95 percentile pressure 10   95th percentile leak 10.2   apnea index 6.0 /hr  apnea-hypopnea index  6.5 /hr   total days used  >4 hr 27 days  total days used <4 hr 2 days  Total compliance 90 percent  He is doing great no problems or questions at this time

## 2019-09-16 ENCOUNTER — Telehealth: Payer: Self-pay

## 2019-09-16 NOTE — Telephone Encounter (Signed)
Confirmed appointment on 09/20/2019 and screened for covid. klh 

## 2019-09-20 ENCOUNTER — Other Ambulatory Visit: Payer: Self-pay

## 2019-09-20 ENCOUNTER — Ambulatory Visit (INDEPENDENT_AMBULATORY_CARE_PROVIDER_SITE_OTHER): Payer: Commercial Managed Care - PPO | Admitting: Internal Medicine

## 2019-09-20 ENCOUNTER — Encounter: Payer: Self-pay | Admitting: Internal Medicine

## 2019-09-20 VITALS — BP 140/87 | HR 98 | Temp 97.3°F | Resp 16 | Ht 70.0 in | Wt 245.0 lb

## 2019-09-20 DIAGNOSIS — G4733 Obstructive sleep apnea (adult) (pediatric): Secondary | ICD-10-CM

## 2019-09-20 DIAGNOSIS — K219 Gastro-esophageal reflux disease without esophagitis: Secondary | ICD-10-CM | POA: Diagnosis not present

## 2019-09-20 DIAGNOSIS — Z9989 Dependence on other enabling machines and devices: Secondary | ICD-10-CM | POA: Diagnosis not present

## 2019-09-20 NOTE — Progress Notes (Signed)
[facility] [place], [place] [address]  Pulmonary Sleep Medicine   Office Visit Note  Patient Name: Phillip Alexander DOB: [birth year] MRN [id number]  Date of Service: 09/20/2019  Complaints/HPI: Pt is here for pulmonary follow up. Pt reports good compliance with CPAP therapy. Cleaning machine by hand, and changing filters and tubing as directed. Denies headaches, sinus issues, palpitations, or hemoptysis.       ROS  General: (-) fever, (-) chills, (-) night sweats, (-) weakness Skin: (-) rashes, (-) itching,. Eyes: (-) visual changes, (-) redness, (-) itching. Nose and Sinuses: (-) nasal stuffiness or itchiness, (-) postnasal drip, (-) nosebleeds, (-) sinus trouble. Mouth and Throat: (-) sore throat, (-) hoarseness. Neck: (-) swollen glands, (-) enlarged thyroid, (-) neck pain. Respiratory: - cough, (-) bloody sputum, - shortness of breath, - wheezing. Cardiovascular: - ankle swelling, (-) chest pain. Lymphatic: (-) lymph node enlargement. Neurologic: (-) numbness, (-) tingling. Psychiatric: (-) anxiety, (-) depression   Current Medication: Outpatient Encounter Medications as of 09/20/2019  Medication Sig Note  . aspirin 81 MG tablet Take 81 mg by mouth daily.   Marland Kitchen atorvastatin (LIPITOR) 40 MG tablet TK 1 T PO QD 06/15/2015: Received from: External Pharmacy  . lisinopril (PRINIVIL,ZESTRIL) 10 MG tablet  06/15/2015: Received from: External Pharmacy  . metFORMIN (GLUCOPHAGE) 500 MG tablet TK 1 T PO BID WITH MEALS 06/15/2015: Received from: External Pharmacy  . Multiple Vitamin (MULTIVITAMIN) capsule Take by mouth. 06/15/2015: Received from: [facility]  . pantoprazole (PROTONIX) 40 MG tablet TK 1 T PO  DAILY 06/15/2015: Received from: External Pharmacy  . PARoxetine (PAXIL) 20 MG tablet TK 1 T PO QD 06/15/2015: Received from: External Pharmacy   No facility-administered encounter medications on file as of 09/20/2019.    Surgical History: Past  Surgical History:  Procedure Laterality Date  . CHOLECYSTECTOMY    . COLONOSCOPY WITH PROPOFOL N/A 04/20/2017   Procedure: COLONOSCOPY WITH PROPOFOL;  Surgeon: Lucilla Lame, MD;  Location: [facility];  Service: Endoscopy;  Laterality: N/A;  Diabetic - oral meds  . HERNIA REPAIR      Medical History: Past Medical History:  Diagnosis Date  . Diabetes mellitus, type 2 ([place])   . GERD (gastroesophageal reflux disease)   . Hyperlipidemia   . Hypertension     Family History: History reviewed. No pertinent family history.  Social History: Social History   Socioeconomic History  . Marital status: Married    Spouse name: Not on file  . Number of children: Not on file  . Years of education: Not on file  . Highest education level: Not on file  Occupational History  . Not on file  Tobacco Use  . Smoking status: Former Smoker    Types: Cigarettes    Quit date: 2007    Years since quitting: 14.3  . Smokeless tobacco: Never Used  Substance and Sexual Activity  . Alcohol use: Yes    Comment: rare - Holidays  . Drug use: Not on file  . Sexual activity: Not on file  Other Topics Concern  . Not on file  Social History Narrative  . Not on file   Social Determinants of Health   Financial Resource Strain:   . Difficulty of Paying Living Expenses:   Food Insecurity:   . Worried About Charity fundraiser in the Last Year:   . Arboriculturist in the Last Year:   Transportation Needs:   . Lack of Transportation (  Medical):   Marland Kitchen Lack of Transportation (Non-Medical):   Physical Activity:   . Days of Exercise per Week:   . Minutes of Exercise per Session:   Stress:   . Feeling of Stress :   Social Connections:   . Frequency of Communication with Friends and Family:   . Frequency of Social Gatherings with Friends and Family:   . Attends Religious Services:   . Active Member of Clubs or Organizations:   . Attends Archivist Meetings:   Marland Kitchen Marital Status:    Intimate Partner Violence:   . Fear of Current or Ex-Partner:   . Emotionally Abused:   Marland Kitchen Physically Abused:   . Sexually Abused:     Vital Signs: Blood pressure 140/87, pulse 98, temperature (!) 97.3 F (36.3 C), resp. rate 16, height 5\' 10"  (1.778 m), weight 245 lb (111.1 kg), SpO2 96 %.  Examination: General Appearance: The patient is well-developed, well-nourished, and in no distress. Skin: Gross inspection of skin unremarkable. Head: normocephalic, no gross deformities. Eyes: no gross deformities noted. ENT: ears appear grossly normal no exudates. Neck: Supple. No thyromegaly. No LAD. Respiratory: clear bilaterally. Cardiovascular: Normal S1 and S2 without murmur or rub. Extremities: No cyanosis. pulses are equal. Neurologic: Alert and oriented. No involuntary movements.  LABS: No results found for this or any previous visit (from the past 2160 hour(s)).  Radiology: US Abdomen Limited RUQ  Result Date: 01/18/2018 CLINICAL DATA:  Elevated LFTs EXAM: ULTRASOUND ABDOMEN LIMITED RIGHT UPPER QUADRANT COMPARISON:  None. FINDINGS: Gallbladder: Surgically removed Common bile duct: Diameter: 3.7 mm Liver: Diffuse increased echogenicity is noted consistent with fatty infiltration. No focal mass is noted. Portal vein is patent on color Doppler imaging with normal direction of blood flow towards the liver. IMPRESSION: Status post cholecystectomy. Fatty liver without acute abnormality. Electronically Signed   By: Inez Catalina M.D.   On: 01/18/2018 08:54    No results found.  No results found.    Assessment and Plan: Patient Active Problem List   Diagnosis Date Noted  . Special screening for malignant neoplasms, colon   . Essential (primary) hypertension 06/15/2015  . Malignant melanoma of skin ([place]) 05/30/2014  . Controlled type 2 diabetes mellitus without complication ([place]) [id number]  . Pure hypercholesterolemia 07/06/2013  . Acid reflux 06/17/2011  . Morbid obesity ([place])  06/17/2011  . 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 OSA on CPAP Continue to wear cpap when sleeping as discussed.    2. Gastroesophageal reflux disease without esophagitis Continue to use Protonix as prescribed. Excellent control of symptoms.   General Counseling: I have discussed the findings of the evaluation and examination with Jeneen Rinks.  I have also discussed any further diagnostic evaluation thatmay be needed or ordered today. Aikeem verbalizes understanding of the findings of todays visit. We also reviewed his medications today and discussed drug interactions and side effects including but not limited excessive drowsiness and altered mental states. We also discussed that there is always a risk not just to him but also people around him. he has been encouraged to call the office with any questions or concerns that should arise related to todays visit.  No orders of the defined types were placed in this encounter.    Time spent: 30 This patient was seen by Orson Gear AGNP-C in Collaboration with Dr. Devona Konig as a part of collaborative care agreement.   I have personally obtained a history, examined the patient, evaluated laboratory and imaging results, formulated the assessment  and plan and placed orders.    Saadat A Khan, MD FCCP Pulmonary and Critical Care Sleep medicine 

## 2020-02-29 ENCOUNTER — Ambulatory Visit: Payer: Commercial Managed Care - PPO

## 2020-03-07 ENCOUNTER — Ambulatory Visit (INDEPENDENT_AMBULATORY_CARE_PROVIDER_SITE_OTHER): Payer: Commercial Managed Care - PPO

## 2020-03-07 ENCOUNTER — Other Ambulatory Visit: Payer: Self-pay

## 2020-03-07 DIAGNOSIS — G4733 Obstructive sleep apnea (adult) (pediatric): Secondary | ICD-10-CM

## 2020-03-07 NOTE — Progress Notes (Signed)
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 No problems or questions at this time

## 2020-03-20 ENCOUNTER — Ambulatory Visit (INDEPENDENT_AMBULATORY_CARE_PROVIDER_SITE_OTHER): Payer: Commercial Managed Care - PPO | Admitting: Internal Medicine

## 2020-03-20 ENCOUNTER — Other Ambulatory Visit: Payer: Self-pay

## 2020-03-20 ENCOUNTER — Encounter: Payer: Self-pay | Admitting: Internal Medicine

## 2020-03-20 VITALS — BP 120/80 | HR 107 | Temp 97.9°F | Resp 16 | Ht 70.0 in | Wt 250.0 lb

## 2020-03-20 DIAGNOSIS — K219 Gastro-esophageal reflux disease without esophagitis: Secondary | ICD-10-CM | POA: Diagnosis not present

## 2020-03-20 DIAGNOSIS — G4733 Obstructive sleep apnea (adult) (pediatric): Secondary | ICD-10-CM | POA: Diagnosis not present

## 2020-03-20 DIAGNOSIS — I10 Essential (primary) hypertension: Secondary | ICD-10-CM | POA: Diagnosis not present

## 2020-03-20 NOTE — Progress Notes (Signed)
[facility] [place], [place] [address]  Pulmonary Sleep Medicine   Office Visit Note  Patient Name: Phillip Alexander DOB: 05/02/1966 MRN [id number]  Date of Service: 03/20/2020  Complaints/HPI: OSA is doing well overall. He feels better. More alert on waking also he states he is more alert in the evening. No nocturia. Snoring is better. Also has DM and HTN and GERD. BMI is 35.87 and is trying to start working on weight reduction.  Patient is more active now trying to work with his wife and get back into exercising.  He has no swelling of the legs noted.  He has never fallen asleep while driving.  Denies having any headaches no dizziness.  Heart issues have been under control and he is working on getting his sugar under better control.  ROS  General: (-) fever, (-) chills, (-) night sweats, (-) weakness Skin: (-) rashes, (-) itching,. Eyes: (-) visual changes, (-) redness, (-) itching. Nose and Sinuses: (-) nasal stuffiness or itchiness, (-) postnasal drip, (-) nosebleeds, (-) sinus trouble. Mouth and Throat: (-) sore throat, (-) hoarseness. Neck: (-) swollen glands, (-) enlarged thyroid, (-) neck pain. Respiratory: - cough, (-) bloody sputum, - shortness of breath, - wheezing. Cardiovascular: - ankle swelling, (-) chest pain. Lymphatic: (-) lymph node enlargement. Neurologic: (-) numbness, (-) tingling. Psychiatric: (-) anxiety, (-) depression   Current Medication: Outpatient Encounter Medications as of 03/20/2020  Medication Sig Note  . aspirin 81 MG tablet Take 81 mg by mouth daily.   Marland Kitchen atorvastatin (LIPITOR) 40 MG tablet TK 1 T PO QD 06/15/2015: Received from: External Pharmacy  . lisinopril (PRINIVIL,ZESTRIL) 10 MG tablet  06/15/2015: Received from: External Pharmacy  . metFORMIN (GLUCOPHAGE) 500 MG tablet TK 1 T PO BID WITH MEALS 06/15/2015: Received from: External Pharmacy  . Multiple Vitamin (MULTIVITAMIN) capsule Take by mouth. 06/15/2015: Received  from: [facility]  . pantoprazole (PROTONIX) 40 MG tablet TK 1 T PO  DAILY 06/15/2015: Received from: External Pharmacy  . PARoxetine (PAXIL) 20 MG tablet TK 1 T PO QD 06/15/2015: Received from: External Pharmacy   No facility-administered encounter medications on file as of 03/20/2020.    Surgical History: Past Surgical History:  Procedure Laterality Date  . CHOLECYSTECTOMY    . COLONOSCOPY WITH PROPOFOL N/A 04/20/2017   Procedure: COLONOSCOPY WITH PROPOFOL;  Surgeon: Lucilla Lame, MD;  Location: [facility];  Service: Endoscopy;  Laterality: N/A;  Diabetic - oral meds  . HERNIA REPAIR      Medical History: Past Medical History:  Diagnosis Date  . Diabetes mellitus, type 2 ([place])   . GERD (gastroesophageal reflux disease)   . Hyperlipidemia   . Hypertension   . Sleep apnea     Family History: History reviewed. No pertinent family history.  Social History: Social History   Socioeconomic History  . Marital status: Married    Spouse name: Not on file  . Number of children: Not on file  . Years of education: Not on file  . Highest education level: Not on file  Occupational History  . Not on file  Tobacco Use  . Smoking status: Former Smoker    Types: Cigarettes    Quit date: 2007    Years since quitting: 14.8  . Smokeless tobacco: Never Used  Vaping Use  . Vaping Use: Never used  Substance and Sexual Activity  . Alcohol use: Yes    Comment: rare - Holidays  . Drug use: Not on file  .  Sexual activity: Not on file  Other Topics Concern  . Not on file  Social History Narrative  . Not on file   Social Determinants of Health   Financial Resource Strain:   . Difficulty of Paying Living Expenses: Not on file  Food Insecurity:   . Worried About Charity fundraiser in the Last Year: Not on file  . Ran Out of Food in the Last Year: Not on file  Transportation Needs:   . Lack of Transportation (Medical): Not on file  . Lack of Transportation  (Non-Medical): Not on file  Physical Activity:   . Days of Exercise per Week: Not on file  . Minutes of Exercise per Session: Not on file  Stress:   . Feeling of Stress : Not on file  Social Connections:   . Frequency of Communication with Friends and Family: Not on file  . Frequency of Social Gatherings with Friends and Family: Not on file  . Attends Religious Services: Not on file  . Active Member of Clubs or Organizations: Not on file  . Attends Archivist Meetings: Not on file  . Marital Status: Not on file  Intimate Partner Violence:   . Fear of Current or Ex-Partner: Not on file  . Emotionally Abused: Not on file  . Physically Abused: Not on file  . Sexually Abused: Not on file    Vital Signs: Blood pressure 120/80, pulse (!) 107, temperature 97.9 F (36.6 C), resp. rate 16, height 5\' 10"  (1.778 m), weight 250 lb (113.4 kg), SpO2 98 %.  Examination: General Appearance: The patient is well-developed, well-nourished, and in no distress. Skin: Gross inspection of skin unremarkable. Head: normocephalic, no gross deformities. Eyes: no gross deformities noted. ENT: ears appear grossly normal no exudates. Neck: Supple. No thyromegaly. No LAD. Respiratory: no rhonchi noted. Cardiovascular: Normal S1 and S2 without murmur or rub. Extremities: No cyanosis. pulses are equal. Neurologic: Alert and oriented. No involuntary movements.  LABS: No results found for this or any previous visit (from the past 2160 hour(s)).  Radiology: US Abdomen Limited RUQ  Result Date: 01/18/2018 CLINICAL DATA:  Elevated LFTs EXAM: ULTRASOUND ABDOMEN LIMITED RIGHT UPPER QUADRANT COMPARISON:  None. FINDINGS: Gallbladder: Surgically removed Common bile duct: Diameter: 3.7 mm Liver: Diffuse increased echogenicity is noted consistent with fatty infiltration. No focal mass is noted. Portal vein is patent on color Doppler imaging with normal direction of blood flow towards the liver. IMPRESSION:  Status post cholecystectomy. Fatty liver without acute abnormality. Electronically Signed   By: Inez Catalina M.D.   On: 01/18/2018 08:54    No results found.  No results found.    Assessment and Plan: Patient Active Problem List   Diagnosis Date Noted  . Special screening for malignant neoplasms, colon   . Essential (primary) hypertension 06/15/2015  . Malignant melanoma of skin ([place]) 05/30/2014  . Controlled type 2 diabetes mellitus without complication ([place]) 73/71/0626  . Pure hypercholesterolemia 07/06/2013  . Acid reflux 06/17/2011  . Morbid obesity ([place]) 06/17/2011  . 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  Overall though he states he does note a significant difference in his daytime sleepiness when he uses the CPAP device. 2. Morbid obesity work on diet and exercise patient is been encouraged to work with his wife and get out and be more active  to work on losing weight 3. GERD this is under control seems to be better when he is using the CPAP 4. HTN we will follow with his PCP blood pressure is under better control today.  General Counseling: I have discussed the findings of the evaluation and examination with Phillip Alexander.  I have also discussed any further diagnostic evaluation thatmay be needed or ordered today. Phillip Alexander verbalizes understanding of the findings of todays visit. We also reviewed his medications today and discussed drug interactions and side effects including but not limited excessive drowsiness and altered mental states. We also discussed that there is always a risk not just to him but also people around him. he has been encouraged to call the office with any questions or concerns that should arise related to todays visit.  No orders of the defined types were placed in this encounter.    Time spent: 78  I have personally  obtained a history, examined the patient, evaluated laboratory and imaging results, formulated the assessment and plan and placed orders.    Allyne Gee, MD [facility] Pulmonary and Critical Care Sleep medicine

## 2020-03-20 NOTE — Patient Instructions (Signed)

## 2020-09-05 ENCOUNTER — Other Ambulatory Visit: Payer: Self-pay

## 2020-09-05 ENCOUNTER — Ambulatory Visit (INDEPENDENT_AMBULATORY_CARE_PROVIDER_SITE_OTHER): Payer: Commercial Managed Care - PPO

## 2020-09-05 DIAGNOSIS — G4733 Obstructive sleep apnea (adult) (pediatric): Secondary | ICD-10-CM

## 2020-09-05 NOTE — Progress Notes (Signed)
95 percentile pressure 10   95th percentile leak 12.7   apnea index 4.1 /hr  apnea-hypopnea index  4.7 /hr   total days used  >4 hr 31 days  total days used <4 hr 24 days  Total compliance 34 percent  Encouraged more usage, will send him new supplies. No other problems or questions

## 2020-09-18 ENCOUNTER — Ambulatory Visit: Payer: Commercial Managed Care - PPO | Admitting: Internal Medicine

## 2020-10-16 ENCOUNTER — Ambulatory Visit: Payer: Commercial Managed Care - PPO | Admitting: Internal Medicine

## 2020-11-01 ENCOUNTER — Encounter: Payer: Self-pay | Admitting: Internal Medicine

## 2020-11-01 ENCOUNTER — Other Ambulatory Visit: Payer: Self-pay

## 2020-11-01 ENCOUNTER — Ambulatory Visit (INDEPENDENT_AMBULATORY_CARE_PROVIDER_SITE_OTHER): Payer: Commercial Managed Care - PPO | Admitting: Internal Medicine

## 2020-11-01 VITALS — BP 132/80 | HR 94 | Temp 97.8°F | Resp 16 | Ht 70.0 in | Wt 253.0 lb

## 2020-11-01 DIAGNOSIS — Z7189 Other specified counseling: Secondary | ICD-10-CM | POA: Diagnosis not present

## 2020-11-01 DIAGNOSIS — G4733 Obstructive sleep apnea (adult) (pediatric): Secondary | ICD-10-CM

## 2020-11-01 DIAGNOSIS — K219 Gastro-esophageal reflux disease without esophagitis: Secondary | ICD-10-CM

## 2020-11-01 NOTE — Progress Notes (Signed)
[facility] [place], [place] [address]  Pulmonary Sleep Medicine   Office Visit Note  Patient Name: Phillip Alexander DOB: April 18, 1966 MRN [id number]  Date of Service: 11/01/2020  Complaints/HPI: OSA on CPAP therapy  ROS  General: (-) fever, (-) chills, (-) night sweats, (-) weakness Skin: (-) rashes, (-) itching,. Eyes: (-) visual changes, (-) redness, (-) itching. Nose and Sinuses: (-) nasal stuffiness or itchiness, (-) postnasal drip, (-) nosebleeds, (-) sinus trouble. Mouth and Throat: (-) sore throat, (-) hoarseness. Neck: (-) swollen glands, (-) enlarged thyroid, (-) neck pain. Respiratory: - cough, (-) bloody sputum, - shortness of breath, - wheezing. Cardiovascular: - ankle swelling, (-) chest pain. Lymphatic: (-) lymph node enlargement. Neurologic: (-) numbness, (-) tingling. Psychiatric: (-) anxiety, (-) depression   Current Medication: Outpatient Encounter Medications as of 11/01/2020  Medication Sig Note   aspirin 81 MG tablet Take 81 mg by mouth daily.    atorvastatin (LIPITOR) 40 MG tablet TK 1 T PO QD 06/15/2015: Received from: External Pharmacy   lisinopril (PRINIVIL,ZESTRIL) 10 MG tablet  06/15/2015: Received from: External Pharmacy   metFORMIN (GLUCOPHAGE) 500 MG tablet TK 1 T PO BID WITH MEALS 06/15/2015: Received from: External Pharmacy   Multiple Vitamin (MULTIVITAMIN) capsule Take by mouth. 06/15/2015: Received from: [place]   pantoprazole (PROTONIX) 40 MG tablet TK 1 T PO  DAILY 06/15/2015: Received from: External Pharmacy   PARoxetine (PAXIL) 20 MG tablet TK 1 T PO QD 06/15/2015: Received from: External Pharmacy   No facility-administered encounter medications on file as of 11/01/2020.    Surgical History: Past Surgical History:  Procedure Laterality Date   CHOLECYSTECTOMY     COLONOSCOPY WITH PROPOFOL N/A 04/20/2017   Procedure: COLONOSCOPY WITH PROPOFOL;  Surgeon: Lucilla Lame, MD;  Location: [facility];  Service: Endoscopy;  Laterality: N/A;  Diabetic - oral meds   HERNIA REPAIR      Medical History: Past Medical History:  Diagnosis Date   Diabetes mellitus, type 2 (HCC)    GERD (gastroesophageal reflux disease)    Hyperlipidemia    Hypertension    Sleep apnea     Family History: History reviewed. No pertinent family history.  Social History: Social History   Socioeconomic History   Marital status: Married    Spouse name: Not on file   Number of children: Not on file   Years of education: Not on file   Highest education level: Not on file  Occupational History   Not on file  Tobacco Use   Smoking status: Former    Pack years: 0.00    Types: Cigarettes    Quit date: 2007    Years since quitting: 15.5   Smokeless tobacco: Never  Vaping Use   Vaping Use: Never used  Substance and Sexual Activity   Alcohol use: Yes    Comment: rare - Holidays   Drug use: Not on file   Sexual activity: Not on file  Other Topics Concern   Not on file  Social History Narrative   Not on file   Social Determinants of Health   Financial Resource Strain: Not on file  Food Insecurity: Not on file  Transportation Needs: Not on file  Physical Activity: Not on file  Stress: Not on file  Social Connections: Not on file  Intimate Partner Violence: Not on file    Vital Signs: Blood pressure 132/80, pulse 94, temperature 97.8 F (36.6 C), resp. rate 16, height 5\' 10"  (1.778  m), weight 253 lb (114.8 kg), SpO2 98 %.  Examination: General Appearance: The patient is well-developed, well-nourished, and in no distress. Skin: Gross inspection of skin unremarkable. Head: normocephalic, no gross deformities. Eyes: no gross deformities noted. ENT: ears appear grossly normal no exudates. Neck: Supple. No thyromegaly. No LAD. Respiratory: no rhonchi . Cardiovascular: Normal S1 and S2 without murmur or rub. Extremities: No cyanosis. pulses are equal. Neurologic: Alert and oriented. No  involuntary movements.  LABS: No results found for this or any previous visit (from the past 2160 hour(s)).  Radiology: US Abdomen Limited RUQ  Result Date: 01/18/2018 CLINICAL DATA:  Elevated LFTs EXAM: ULTRASOUND ABDOMEN LIMITED RIGHT UPPER QUADRANT COMPARISON:  None. FINDINGS: Gallbladder: Surgically removed Common bile duct: Diameter: 3.7 mm Liver: Diffuse increased echogenicity is noted consistent with fatty infiltration. No focal mass is noted. Portal vein is patent on color Doppler imaging with normal direction of blood flow towards the liver. IMPRESSION: Status post cholecystectomy. Fatty liver without acute abnormality. Electronically Signed   By: Inez Catalina M.D.   On: 01/18/2018 08:54    No results found.  No results found.    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 Obstructive sleep apnea His compliance needs to improve discussed with him. He states that he has had issues with the mask fit. Now has a new mask and is doing better  2. Obesity, morbid ([place]) Obesity Counseling: Had a lengthy discussion regarding patients BMI and weight issues. Patient was instructed on portion control as well as increased activity. Also discussed caloric restrictions with trying to maintain intake less than 2000 Kcal. Discussions were made in accordance with the 5As of weight management. Simple actions such as not eating late and if able to, taking a walk is suggested.   3. Gastroesophageal reflux disease without esophagitis PPI as needed work on weight loss he has gained 15 pounds since 2020  4.  CPAP use counseling CPAP Counseling: had a lengthy discussion with the patient regarding the importance of PAP therapy in management of the sleep apnea. Patient appears to understand the risk factor reduction and also understands the risks associated with untreated sleep apnea. Patient will try to make a good faith effort to remain compliant with therapy. Also instructed the patient on proper cleaning of the device including the water must be changed daily if possible and use of distilled water is preferred. Patient understands that the machine should be regularly cleaned with appropriate recommended cleaning solutions that do not damage the PAP machine for example given white vinegar and water rinses. Other methods such as ozone treatment may not be as good as these simple methods to achieve cleaning.    General Counseling: I have discussed the findings of the evaluation and examination with Jeneen Rinks.  I have also discussed any further diagnostic evaluation thatmay be needed or ordered today. Elihue verbalizes understanding of the findings of todays visit. We also reviewed his medications today and discussed drug interactions and side effects including but not limited excessive drowsiness and altered mental states. We also discussed that there is always a risk not just to him but also  people around him. he has been encouraged to call the office with any questions or concerns that should arise related to todays visit.  No orders of the defined types were placed in this encounter.    Time spent: 53  I have personally obtained a history, examined the patient, evaluated laboratory and imaging results, formulated the assessment and plan and placed orders.    Allyne Gee, MD [facility] Pulmonary and Critical Care Sleep medicine

## 2020-11-01 NOTE — Patient Instructions (Signed)

## 2021-03-06 ENCOUNTER — Ambulatory Visit: Payer: Commercial Managed Care - PPO

## 2021-03-20 ENCOUNTER — Ambulatory Visit (INDEPENDENT_AMBULATORY_CARE_PROVIDER_SITE_OTHER): Payer: BC Managed Care – PPO

## 2021-03-20 ENCOUNTER — Other Ambulatory Visit: Payer: Self-pay

## 2021-03-20 DIAGNOSIS — G4733 Obstructive sleep apnea (adult) (pediatric): Secondary | ICD-10-CM

## 2021-03-20 NOTE — Progress Notes (Signed)
95 percentile pressure 10   95th percentile leak 12.0   apnea index 3.4 /hr  apnea-hypopnea index  4.1 /hr   total days used  >4 hr 54 days  total days used <4 hr 10 days  Total compliance 60 percent  Mask is worn out will see about new mask no other problems or questions   Pt was seen by Claiborne Billings  RRT/RCP  from [employer]

## 2021-04-18 ENCOUNTER — Other Ambulatory Visit: Payer: Self-pay

## 2021-04-18 ENCOUNTER — Ambulatory Visit: Payer: BC Managed Care – PPO | Admitting: Physician Assistant

## 2021-04-18 ENCOUNTER — Encounter: Payer: Self-pay | Admitting: Physician Assistant

## 2021-04-18 VITALS — BP 140/80 | HR 93 | Temp 97.8°F | Resp 16 | Ht 70.0 in | Wt 258.0 lb

## 2021-04-18 DIAGNOSIS — E669 Obesity, unspecified: Secondary | ICD-10-CM

## 2021-04-18 DIAGNOSIS — I10 Essential (primary) hypertension: Secondary | ICD-10-CM

## 2021-04-18 DIAGNOSIS — Z7189 Other specified counseling: Secondary | ICD-10-CM

## 2021-04-18 DIAGNOSIS — Z6839 Body mass index (BMI) 39.0-39.9, adult: Secondary | ICD-10-CM

## 2021-04-18 DIAGNOSIS — G4733 Obstructive sleep apnea (adult) (pediatric): Secondary | ICD-10-CM | POA: Diagnosis not present

## 2021-04-18 NOTE — Progress Notes (Signed)
[facility] [place], [place] [address]  Pulmonary Sleep Medicine   Office Visit Note  Patient Name: Phillip Alexander DOB: 1966/02/03 MRN [id number]  Date of Service: 04/24/2021  Complaints/HPI: Pt is here for routine pulmonary follow up for OSA. 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 Download reviewed and shows 60% compliance with an AHI of 4.1/hr. He does have some mask leak and is in need of new supplies. He denies any headaches, SOB, or dryness. He will try to increase usage time as he is able with his work.He is benefiting from the machine.  ROS  General: (-) fever, (-) chills, (-) night sweats, (-) weakness Skin: (-) rashes, (-) itching,. Eyes: (-) visual changes, (-) redness, (-) itching. Nose and Sinuses: (-) nasal stuffiness or itchiness, (-) postnasal drip, (-) nosebleeds, (-) sinus trouble. Mouth and Throat: (-) sore throat, (-) hoarseness. Neck: (-) swollen glands, (-) enlarged thyroid, (-) neck pain. Respiratory: - cough, (-) bloody sputum, - shortness of breath, - wheezing. Cardiovascular: - ankle swelling, (-) chest pain. Lymphatic: (-) lymph node enlargement. Neurologic: (-) numbness, (-) tingling. Psychiatric: (-) anxiety, (-) depression   Current Medication: Outpatient Encounter Medications as of 04/18/2021  Medication Sig Note   aspirin 81 MG tablet Take 81 mg by mouth daily.    atorvastatin (LIPITOR) 40 MG tablet TK 1 T PO QD 06/15/2015: Received from: External Pharmacy   lisinopril (PRINIVIL,ZESTRIL) 10 MG tablet  06/15/2015: Received from: External Pharmacy   metFORMIN (GLUCOPHAGE) 500 MG tablet TK 1 T PO BID WITH MEALS 06/15/2015: Received from: External Pharmacy   Multiple Vitamin (MULTIVITAMIN) capsule Take by mouth. 06/15/2015: Received from: [place]   pantoprazole (PROTONIX) 40 MG tablet TK 1 T PO  DAILY 06/15/2015: Received from: External Pharmacy   PARoxetine (PAXIL) 20 MG tablet  TK 1 T PO QD 06/15/2015: Received from: External Pharmacy   No facility-administered encounter medications on file as of 04/18/2021.    Surgical History: Past Surgical History:  Procedure Laterality Date   CHOLECYSTECTOMY     COLONOSCOPY WITH PROPOFOL N/A 04/20/2017   Procedure: COLONOSCOPY WITH PROPOFOL;  Surgeon: Lucilla Lame, MD;  Location: [place];  Service: Endoscopy;  Laterality: N/A;  Diabetic - oral meds   HERNIA REPAIR      Medical History: Past Medical History:  Diagnosis Date   Diabetes mellitus, type 2 (HCC)    GERD (gastroesophageal reflux disease)    Hyperlipidemia    Hypertension    Sleep apnea     Family History: History reviewed. No pertinent family history.  Social History: Social History   Socioeconomic History   Marital status: Married    Spouse name: Not on file   Number of children: Not on file   Years of education: Not on file   Highest education level: Not on file  Occupational History   Not on file  Tobacco Use   Smoking status: Former    Types: Cigarettes    Quit date: 2007    Years since quitting: 15.9   Smokeless tobacco: Never  Vaping Use   Vaping Use: Never used  Substance and Sexual Activity   Alcohol use: Yes    Comment: rare - Holidays   Drug use: Not on file   Sexual activity: Not on file  Other Topics Concern   Not on file  Social History Narrative   Not on file   Social Determinants of Health  Financial Resource Strain: Not on file  Food Insecurity: Not on file  Transportation Needs: Not on file  Physical Activity: Not on file  Stress: Not on file  Social Connections: Not on file  Intimate Partner Violence: Not on file    Vital Signs: Blood pressure 140/80, pulse 93, temperature 97.8 F (36.6 C), resp. rate 16, height 5\' 10"  (1.778 m), weight 258 lb (117 kg), SpO2 95 %.  Examination: General Appearance: The patient is well-developed, well-nourished, and in no distress. Skin: Gross inspection of  skin unremarkable. Head: normocephalic, no gross deformities. Eyes: no gross deformities noted. ENT: ears appear grossly normal no exudates. Neck: Supple. No thyromegaly. No LAD. Respiratory: Lungs clear to auscultation bilaterally. Cardiovascular: Normal S1 and S2 without murmur or rub. Extremities: No cyanosis. pulses are equal. Neurologic: Alert and oriented. No involuntary movements.  LABS: No results found for this or any previous visit (from the past 2160 hour(s)).  Radiology: US Abdomen Limited RUQ  Result Date: 01/18/2018 CLINICAL DATA:  Elevated LFTs EXAM: ULTRASOUND ABDOMEN LIMITED RIGHT UPPER QUADRANT COMPARISON:  None. FINDINGS: Gallbladder: Surgically removed Common bile duct: Diameter: 3.7 mm Liver: Diffuse increased echogenicity is noted consistent with fatty infiltration. No focal mass is noted. Portal vein is patent on color Doppler imaging with normal direction of blood flow towards the liver. IMPRESSION: Status post cholecystectomy. Fatty liver without acute abnormality. Electronically Signed   By: Inez Catalina M.D.   On: 01/18/2018 08:54    No results found.  No results found.    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 Obstructive sleep apnea Use cpap nightly--order for new supplies placed - For home use only DME Other see comment  2. CPAP use counseling CPAP couseling-Discussed importance of adequate CPAP use as well as proper care and cleaning techniques of machine and all supplies.  3. Essential hypertension Continue current medication and f/u with PCP.  4.  Obesity (BMI 30-39.9) Obesity Counseling: Had a lengthy discussion regarding patients BMI and weight issues. Patient was instructed on portion control as well as increased activity. Also discussed caloric restrictions with trying to maintain intake less than 2000 Kcal. Discussions were made in accordance with the 5As of weight management. Simple actions such as not eating late and if able to, taking a walk is suggested.    General Counseling: I have discussed the findings of the evaluation and examination with Jeneen Rinks.  I have also discussed any further diagnostic evaluation thatmay be needed or ordered today. Camil verbalizes understanding of the findings of todays visit. We also reviewed his medications today and discussed drug interactions and side effects including but not limited excessive drowsiness and altered mental states. We also discussed that there is always a risk not just to him but also people around him. he has been encouraged to call the office with any questions or concerns that should arise related to todays visit.  Orders Placed This Encounter  Procedures   For home use only DME Other see comment    CPAP supplies    Order Specific Question:   Length of Need    Answer:   Lifetime  Time spent: 30  I have personally obtained a history, examined the patient, evaluated laboratory and imaging results, formulated the assessment and plan and placed orders. This patient was seen by Drema Dallas, PA-C in collaboration with Dr. Devona Konig as a part of collaborative care agreement.     Allyne Gee, MD [facility] Pulmonary and Critical Care Sleep medicine

## 2021-04-24 ENCOUNTER — Ambulatory Visit: Payer: BC Managed Care – PPO

## 2021-04-24 NOTE — Patient Instructions (Signed)

## 2021-06-10 ENCOUNTER — Telehealth: Payer: Self-pay

## 2021-06-10 NOTE — Telephone Encounter (Signed)
Verbal order for OSA supplies signed by provider and placed in [facility] folder.

## 2021-10-14 ENCOUNTER — Ambulatory Visit: Payer: BC Managed Care – PPO | Admitting: Physician Assistant

## 2021-10-16 ENCOUNTER — Ambulatory Visit: Payer: BC Managed Care – PPO

## 2021-11-15 ENCOUNTER — Ambulatory Visit: Payer: BC Managed Care – PPO | Admitting: Physician Assistant

## 2021-11-28 ENCOUNTER — Ambulatory Visit: Payer: BC Managed Care – PPO | Admitting: Physician Assistant

## 2021-12-20 ENCOUNTER — Ambulatory Visit: Payer: BC Managed Care – PPO | Admitting: Physician Assistant

## 2021-12-26 ENCOUNTER — Encounter: Payer: Self-pay | Admitting: Physician Assistant

## 2021-12-26 ENCOUNTER — Ambulatory Visit (INDEPENDENT_AMBULATORY_CARE_PROVIDER_SITE_OTHER): Payer: PRIVATE HEALTH INSURANCE | Admitting: Physician Assistant

## 2021-12-26 VITALS — BP 144/98 | HR 115 | Temp 98.4°F | Resp 16 | Ht 70.0 in | Wt 252.4 lb

## 2021-12-26 DIAGNOSIS — I10 Essential (primary) hypertension: Secondary | ICD-10-CM | POA: Diagnosis not present

## 2021-12-26 DIAGNOSIS — G4733 Obstructive sleep apnea (adult) (pediatric): Secondary | ICD-10-CM | POA: Diagnosis not present

## 2021-12-26 DIAGNOSIS — Z7189 Other specified counseling: Secondary | ICD-10-CM

## 2021-12-26 DIAGNOSIS — E669 Obesity, unspecified: Secondary | ICD-10-CM

## 2021-12-26 NOTE — Progress Notes (Signed)
[facility] [place], [place] [address]  Pulmonary Sleep Medicine   Office Visit Note  Patient Name: Phillip Alexander DOB: Sep 23, 1965 MRN [id number]  Date of Service: 01/02/2022  Complaints/HPI: Pt is here for routine follow up for OSA on CPAP. He was treated for PNA a few weeks ago. He has another follow up in a week to follow up on this. Wearing CPAP most nights except from recent illness. Unfortunately no download is available. He is changing supplies and keeping machine clean. Still taking albuterol as needed. His BP is elevated and will monitor and follow up with PCP.  ROS  General: (-) fever, (-) chills, (-) night sweats, (-) weakness Skin: (-) rashes, (-) itching,. Eyes: (-) visual changes, (-) redness, (-) itching. Nose and Sinuses: (-) nasal stuffiness or itchiness, (-) postnasal drip, (-) nosebleeds, (-) sinus trouble. Mouth and Throat: (-) sore throat, (-) hoarseness. Neck: (-) swollen glands, (-) enlarged thyroid, (-) neck pain. Respiratory: + cough, (-) bloody sputum, - shortness of breath, - wheezing. Cardiovascular: - ankle swelling, (-) chest pain. Lymphatic: (-) lymph node enlargement. Neurologic: (-) numbness, (-) tingling. Psychiatric: (-) anxiety, (-) depression   Current Medication: Outpatient Encounter Medications as of 12/26/2021  Medication Sig Note   aspirin 81 MG tablet Take 81 mg by mouth daily.    atorvastatin (LIPITOR) 40 MG tablet TK 1 T PO QD 06/15/2015: Received from: External Pharmacy   lisinopril (PRINIVIL,ZESTRIL) 10 MG tablet  06/15/2015: Received from: External Pharmacy   metFORMIN (GLUCOPHAGE) 500 MG tablet TK 1 T PO BID WITH MEALS 06/15/2015: Received from: External Pharmacy   Multiple Vitamin (MULTIVITAMIN) capsule Take by mouth. 06/15/2015: Received from: [place]   pantoprazole (PROTONIX) 40 MG tablet TK 1 T PO  DAILY 06/15/2015: Received from: External Pharmacy   PARoxetine (PAXIL) 20 MG tablet TK 1  T PO QD 06/15/2015: Received from: External Pharmacy   No facility-administered encounter medications on file as of 12/26/2021.    Surgical History: Past Surgical History:  Procedure Laterality Date   CHOLECYSTECTOMY     COLONOSCOPY WITH PROPOFOL N/A 04/20/2017   Procedure: COLONOSCOPY WITH PROPOFOL;  Surgeon: Lucilla Lame, MD;  Location: [place];  Service: Endoscopy;  Laterality: N/A;  Diabetic - oral meds   HERNIA REPAIR      Medical History: Past Medical History:  Diagnosis Date   Diabetes mellitus, type 2 (HCC)    GERD (gastroesophageal reflux disease)    Hyperlipidemia    Hypertension    Sleep apnea     Family History: History reviewed. No pertinent family history.  Social History: Social History   Socioeconomic History   Marital status: Married    Spouse name: Not on file   Number of children: Not on file   Years of education: Not on file   Highest education level: Not on file  Occupational History   Not on file  Tobacco Use   Smoking status: Former    Types: Cigarettes    Quit date: 2007    Years since quitting: 16.6   Smokeless tobacco: Never  Vaping Use   Vaping Use: Never used  Substance and Sexual Activity   Alcohol use: Yes    Comment: rare - Holidays   Drug use: Not on file   Sexual activity: Not on file  Other Topics Concern   Not on file  Social History Narrative   Not on file   Social Determinants of Health   Financial Resource Strain:  Not on file  Food Insecurity: Not on file  Transportation Needs: Not on file  Physical Activity: Not on file  Stress: Not on file  Social Connections: Not on file  Intimate Partner Violence: Not on file    Vital Signs: Blood pressure (!) 144/98, pulse (!) 115, temperature 98.4 F (36.9 C), resp. rate 16, height '5\' 10"'$  (1.778 m), weight 252 lb 6.4 oz (114.5 kg), SpO2 97 %.  Examination: General Appearance: The patient is well-developed, well-nourished, and in no distress. Skin: Gross  inspection of skin unremarkable. Head: normocephalic, no gross deformities. Eyes: no gross deformities noted. ENT: ears appear grossly normal no exudates. Neck: Supple. No thyromegaly. No LAD. Respiratory: Lungs clear to auscultation bilaterally. Cardiovascular: Normal S1 and S2 without murmur or rub. Extremities: No cyanosis. pulses are equal. Neurologic: Alert and oriented. No involuntary movements.  LABS: No results found for this or any previous visit (from the past 2160 hour(s)).  Radiology: US Abdomen Limited RUQ  Result Date: 01/18/2018 CLINICAL DATA:  Elevated LFTs EXAM: ULTRASOUND ABDOMEN LIMITED RIGHT UPPER QUADRANT COMPARISON:  None. FINDINGS: Gallbladder: Surgically removed Common bile duct: Diameter: 3.7 mm Liver: Diffuse increased echogenicity is noted consistent with fatty infiltration. No focal mass is noted. Portal vein is patent on color Doppler imaging with normal direction of blood flow towards the liver. IMPRESSION: Status post cholecystectomy. Fatty liver without acute abnormality. Electronically Signed   By: Inez Catalina M.D.   On: 01/18/2018 08:54    No results found.  No results found.    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 Obstructive sleep apnea Continue nightly use  2. CPAP use counseling CPAP couseling-Discussed importance of adequate CPAP use as well as proper care and cleaning techniques of machine and all supplies.  3. Essential hypertension Elevated in office, will monitor and follow up with PCP  4. Obesity (BMI 30-39.9) Obesity Counseling: Had a  lengthy discussion regarding patients BMI and weight issues. Patient was instructed on portion control as well as increased activity. Also discussed caloric restrictions with trying to maintain intake less than 2000 Kcal. Discussions were made in accordance with the 5As of weight management. Simple actions such as not eating late and if able to, taking a walk is suggested.     General Counseling: I have discussed the findings of the evaluation and examination with Jeneen Rinks.  I have also discussed any further diagnostic evaluation thatmay be needed or ordered today. Quame verbalizes understanding of the findings of todays visit. We also reviewed his medications today and discussed drug interactions and side effects including but not limited excessive drowsiness and altered mental states. We also discussed that there is always a risk not just to him but also people around him. he has been encouraged to call the office with any questions or concerns that should arise related to todays visit.  No orders of the defined types were placed in this encounter.    Time spent: 30  I have personally obtained a history, examined the patient, evaluated laboratory and imaging results, formulated the assessment and plan and placed orders. This patient was seen by Drema Dallas,  PA-C in collaboration with Dr. Devona Konig as a part of collaborative care agreement.     Allyne Gee, MD [facility] Pulmonary and Critical Care Sleep medicine

## 2022-06-04 ENCOUNTER — Telehealth: Payer: Self-pay | Admitting: Internal Medicine

## 2022-06-04 NOTE — Telephone Encounter (Signed)
Left vm and sent mychart message to confirm 06/11/22 appointment-Toni

## 2022-06-11 ENCOUNTER — Ambulatory Visit: Payer: BC Managed Care – PPO

## 2022-06-26 ENCOUNTER — Ambulatory Visit: Payer: BC Managed Care – PPO | Admitting: Physician Assistant

## 2022-07-17 ENCOUNTER — Telehealth: Payer: Self-pay | Admitting: Physician Assistant

## 2022-07-17 ENCOUNTER — Encounter: Payer: Self-pay | Admitting: Physician Assistant

## 2022-07-17 ENCOUNTER — Ambulatory Visit (INDEPENDENT_AMBULATORY_CARE_PROVIDER_SITE_OTHER): Payer: PRIVATE HEALTH INSURANCE | Admitting: Physician Assistant

## 2022-07-17 VITALS — BP 143/89 | HR 112 | Temp 98.4°F | Resp 16 | Ht 70.0 in | Wt 257.2 lb

## 2022-07-17 DIAGNOSIS — I10 Essential (primary) hypertension: Secondary | ICD-10-CM | POA: Diagnosis not present

## 2022-07-17 DIAGNOSIS — Z7189 Other specified counseling: Secondary | ICD-10-CM | POA: Diagnosis not present

## 2022-07-17 DIAGNOSIS — G4733 Obstructive sleep apnea (adult) (pediatric): Secondary | ICD-10-CM | POA: Diagnosis not present

## 2022-07-17 DIAGNOSIS — E669 Obesity, unspecified: Secondary | ICD-10-CM

## 2022-07-17 NOTE — Patient Instructions (Signed)

## 2022-07-17 NOTE — Telephone Encounter (Signed)
Sent message to [facility] w/ [facility] for cpap supplies-Toni

## 2022-07-17 NOTE — Progress Notes (Signed)
[facility] [place], [place] [address]  Pulmonary Sleep Medicine   Office Visit Note  Patient Name: Phillip Alexander DOB: 1966-04-20 MRN [id number]  Date of Service: 07/17/2022  Complaints/HPI: Pt is here for routine follow up for OSA on CPAP. He states he is wearing most nights. He sometimes will fall asleep without it or have to take it off early for a call, but he can tell the difference and improvement in sleep when wearing it all night. He is benefiting from use. Denies dryness, headaches, or SOB. Will be needing supplies. Cleaning supplies regularly. He is unsure which DME company he uses. He did have his last download with [employer] in 2022. Will request download.  ROS  General: (-) fever, (-) chills, (-) night sweats, (-) weakness Skin: (-) rashes, (-) itching,. Eyes: (-) visual changes, (-) redness, (-) itching. Nose and Sinuses: (-) nasal stuffiness or itchiness, (-) postnasal drip, (-) nosebleeds, (-) sinus trouble. Mouth and Throat: (-) sore throat, (-) hoarseness. Neck: (-) swollen glands, (-) enlarged thyroid, (-) neck pain. Respiratory: - cough, (-) bloody sputum, - shortness of breath, - wheezing. Cardiovascular: - ankle swelling, (-) chest pain. Lymphatic: (-) lymph node enlargement. Neurologic: (-) numbness, (-) tingling. Psychiatric: (-) anxiety, (-) depression   Current Medication: Outpatient Encounter Medications as of 07/17/2022  Medication Sig Note   aspirin 81 MG tablet Take 81 mg by mouth daily.    atorvastatin (LIPITOR) 40 MG tablet TK 1 T PO QD 06/15/2015: Received from: External Pharmacy   lisinopril (PRINIVIL,ZESTRIL) 10 MG tablet  06/15/2015: Received from: External Pharmacy   metFORMIN (GLUCOPHAGE) 500 MG tablet TK 1 T PO BID WITH MEALS 06/15/2015: Received from: External Pharmacy   Multiple Vitamin (MULTIVITAMIN) capsule Take by mouth. 06/15/2015: Received from: [facility]   pantoprazole (PROTONIX) 40 MG tablet TK 1  T PO  DAILY 06/15/2015: Received from: External Pharmacy   PARoxetine (PAXIL) 20 MG tablet TK 1 T PO QD 06/15/2015: Received from: External Pharmacy   No facility-administered encounter medications on file as of 07/17/2022.    Surgical History: Past Surgical History:  Procedure Laterality Date   CHOLECYSTECTOMY     COLONOSCOPY WITH PROPOFOL N/A 04/20/2017   Procedure: COLONOSCOPY WITH PROPOFOL;  Surgeon: Lucilla Lame, MD;  Location: [facility];  Service: Endoscopy;  Laterality: N/A;  Diabetic - oral meds   HERNIA REPAIR      Medical History: Past Medical History:  Diagnosis Date   Diabetes mellitus, type 2 (HCC)    GERD (gastroesophageal reflux disease)    Hyperlipidemia    Hypertension    Sleep apnea     Family History: History reviewed. No pertinent family history.  Social History: Social History   Socioeconomic History   Marital status: Married    Spouse name: Not on file   Number of children: Not on file   Years of education: Not on file   Highest education level: Not on file  Occupational History   Not on file  Tobacco Use   Smoking status: Former    Types: Cigarettes    Quit date: 2007    Years since quitting: 17.2   Smokeless tobacco: Never  Vaping Use   Vaping Use: Never used  Substance and Sexual Activity   Alcohol use: Yes    Comment: rare - Holidays   Drug use: Not on file   Sexual activity: Not on file  Other Topics Concern   Not on file  Social History  Narrative   Not on file   Social Determinants of Health   Financial Resource Strain: Not on file  Food Insecurity: Not on file  Transportation Needs: Not on file  Physical Activity: Not on file  Stress: Not on file  Social Connections: Not on file  Intimate Partner Violence: Not on file    Vital Signs: Blood pressure (!) 143/89, pulse (!) 112, temperature 98.4 F (36.9 C), resp. rate 16, height '5\' 10"'$  (1.778 m), weight 257 lb 3.2 oz (116.7 kg), SpO2 97 %.  Examination: General  Appearance: The patient is well-developed, well-nourished, and in no distress. Skin: Gross inspection of skin unremarkable. Head: normocephalic, no gross deformities. Eyes: no gross deformities noted. ENT: ears appear grossly normal no exudates. Neck: Supple. No thyromegaly. No LAD. Respiratory: lungs clear to auscultation bilaterally. Cardiovascular: Normal S1 and S2 without murmur or rub. Extremities: No cyanosis. pulses are equal. Neurologic: Alert and oriented. No involuntary movements.  LABS: No results found for this or any previous visit (from the past 2160 hour(s)).  Radiology: US Abdomen Limited RUQ  Result Date: 01/18/2018 CLINICAL DATA:  Elevated LFTs EXAM: ULTRASOUND ABDOMEN LIMITED RIGHT UPPER QUADRANT COMPARISON:  None. FINDINGS: Gallbladder: Surgically removed Common bile duct: Diameter: 3.7 mm Liver: Diffuse increased echogenicity is noted consistent with fatty infiltration. No focal mass is noted. Portal vein is patent on color Doppler imaging with normal direction of blood flow towards the liver. IMPRESSION: Status post cholecystectomy. Fatty liver without acute abnormality. Electronically Signed   By: Inez Catalina M.D.   On: 01/18/2018 08:54    No results found.  No results found.    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 Obstructive sleep apnea Continue nightly use and will reorder supplies - For home use only DME continuous positive airway pressure (CPAP)  2. CPAP use counseling CPAP couseling-Discussed importance of adequate CPAP use as well as proper care and  cleaning techniques of machine and all supplies.  3. Essential hypertension Continue current medication and f/u with PCP.  4. Obesity (BMI 30-39.9) Obesity Counseling: Had a lengthy discussion regarding patients BMI and weight issues. Patient was instructed on portion control as well as increased activity. Also discussed caloric restrictions with trying to maintain intake less than 2000 Kcal. Discussions were made in accordance with the 5As of weight management. Simple actions such as not eating late and if able to, taking a walk is suggested.    General Counseling: I have discussed the findings of the evaluation and examination with Jeneen Rinks.  I have also discussed any further diagnostic evaluation thatmay be needed or ordered today. Toni verbalizes understanding of the findings of todays visit. We also reviewed his medications today and discussed drug interactions and side effects including but not limited excessive drowsiness and altered mental states. We also discussed that there is always a risk not just to him but also people around him. he has been encouraged to call the office with any questions or concerns that should arise related to todays visit.  Orders Placed This Encounter  Procedures   For home use only DME continuous positive airway pressure (CPAP)  Supplies only, need compliance downlaod    Order Specific Question:   Length of Need    Answer:   Lifetime    Order Specific Question:   Patient has OSA or probable OSA    Answer:   Yes    Order Specific Question:   Is the patient currently using CPAP in the home    Answer:   Yes    Order Specific Question:   Settings    Answer:   Other see comments    Order Specific Question:   CPAP supplies needed    Answer:   Mask, headgear, cushions, filters, heated tubing and water chamber     Time spent: 30  I have personally obtained a history, examined the patient, evaluated laboratory and imaging results, formulated the assessment  and plan and placed orders. This patient was seen by Drema Dallas, PA-C in collaboration with Dr. Devona Konig as a part of collaborative care agreement.     Allyne Gee, MD [facility] Pulmonary and Critical Care Sleep medicine

## 2022-07-21 ENCOUNTER — Telehealth: Payer: Self-pay | Admitting: Physician Assistant

## 2022-07-21 NOTE — Telephone Encounter (Signed)
Beth w/ [facility] wondering if patient is wanting to change mask or just needs more supplies. I lvm for patient to return my call-Toni

## 2022-11-24 ENCOUNTER — Telehealth: Payer: Self-pay | Admitting: Physician Assistant

## 2022-11-24 NOTE — Telephone Encounter (Signed)
Gave pt AMP phone number-nm

## 2022-12-01 NOTE — Telephone Encounter (Signed)
Done

## 2023-01-14 ENCOUNTER — Telehealth: Payer: Self-pay | Admitting: Internal Medicine

## 2023-01-14 NOTE — Telephone Encounter (Signed)
Left vm and sent mychart message to confirm 01/21/23 appointment-Toni

## 2023-01-21 ENCOUNTER — Ambulatory Visit: Payer: BC Managed Care – PPO

## 2023-01-21 DIAGNOSIS — G4733 Obstructive sleep apnea (adult) (pediatric): Secondary | ICD-10-CM

## 2023-01-21 NOTE — Patient Instructions (Signed)
95 percentile pressure 10   95th percentile leak 14.3   apnea index 2.6 /hr  apnea-hypopnea index  3.3 /hr   total days used  >4 hr 82 days  total days used <4 hr 2 days  Total compliance 91 percent  He is doing great. No problems or questions at this time.     Pt was seen by Tresa Endo  RRT/RCP  from [facility]

## 2023-01-21 NOTE — Progress Notes (Signed)
95 percentile pressure 10   95th percentile leak 14.3   apnea index 2.6 /hr  apnea-hypopnea index  3.3 /hr   total days used  >4 hr 82 days  total days used <4 hr 2 days  Total compliance 91 percent  He is doing great. He will need a new rx for supplies Pt was seen by Tresa Endo  RRT/RCP  from [facility]

## 2023-01-29 ENCOUNTER — Ambulatory Visit: Payer: BC Managed Care – PPO | Admitting: Physician Assistant

## 2023-02-02 ENCOUNTER — Ambulatory Visit: Payer: BC Managed Care – PPO | Admitting: Physician Assistant

## 2023-02-06 ENCOUNTER — Telehealth: Payer: Self-pay | Admitting: Physician Assistant

## 2023-02-06 ENCOUNTER — Ambulatory Visit (INDEPENDENT_AMBULATORY_CARE_PROVIDER_SITE_OTHER): Payer: BC Managed Care – PPO | Admitting: Physician Assistant

## 2023-02-06 ENCOUNTER — Encounter: Payer: Self-pay | Admitting: Physician Assistant

## 2023-02-06 VITALS — BP 121/90 | HR 116 | Temp 98.1°F | Resp 16 | Ht 70.0 in | Wt 250.8 lb

## 2023-02-06 DIAGNOSIS — E669 Obesity, unspecified: Secondary | ICD-10-CM

## 2023-02-06 DIAGNOSIS — I10 Essential (primary) hypertension: Secondary | ICD-10-CM | POA: Diagnosis not present

## 2023-02-06 DIAGNOSIS — G4733 Obstructive sleep apnea (adult) (pediatric): Secondary | ICD-10-CM | POA: Diagnosis not present

## 2023-02-06 DIAGNOSIS — Z7189 Other specified counseling: Secondary | ICD-10-CM

## 2023-02-06 NOTE — Progress Notes (Signed)
[facility] [address] [place], [place] [address]  Pulmonary Sleep Medicine   Office Visit Note  Patient Name: Phillip Alexander DOB: Mar 02, 1966 MRN [id number]  Date of Service: 02/12/2023  Complaints/HPI: Pt is here for routine follow up for OSA on CPAP. Some dryness--uses vaseline along nares to help. Discussed adjusting humidity. No SOB or headaches. Using and benefiting from CPAP use. Feels well rested after PAP therapy. Compliance is excellent and apnea is well controlled. Does need supply order.  CPAP compliance Download:  95 percentile pressure 10  95th percentile leak 14.3  apnea index 2.6 /hr  apnea-hypopnea index  3.3 /hr  total days used  >4 hr 82 days  total days used <4 hr 2 days Total compliance 91 percent  ROS  General: (-) fever, (-) chills, (-) night sweats, (-) weakness Skin: (-) rashes, (-) itching,. Eyes: (-) visual changes, (-) redness, (-) itching. Nose and Sinuses: (-) nasal stuffiness or itchiness, (-) postnasal drip, (-) nosebleeds, (-) sinus trouble. Mouth and Throat: (-) sore throat, (-) hoarseness. Neck: (-) swollen glands, (-) enlarged thyroid, (-) neck pain. Respiratory: - cough, (-) bloody sputum, - shortness of breath, - wheezing. Cardiovascular: - ankle swelling, (-) chest pain. Lymphatic: (-) lymph node enlargement. Neurologic: (-) numbness, (-) tingling. Psychiatric: (-) anxiety, (-) depression   Current Medication: Outpatient Encounter Medications as of 02/06/2023  Medication Sig Note   aspirin 81 MG tablet Take 81 mg by mouth daily.    atorvastatin (LIPITOR) 40 MG tablet TK 1 T PO QD 06/15/2015: Received from: External Pharmacy   lisinopril (PRINIVIL,ZESTRIL) 10 MG tablet  06/15/2015: Received from: External Pharmacy   metFORMIN (GLUCOPHAGE) 500 MG tablet TK 1 T PO BID WITH MEALS 06/15/2015: Received from: External Pharmacy   Multiple Vitamin (MULTIVITAMIN) capsule Take by mouth. 06/15/2015: Received from: [facility]   pantoprazole (PROTONIX) 40 MG tablet TK 1 T PO  DAILY 06/15/2015: Received from: External Pharmacy   PARoxetine (PAXIL) 20 MG tablet TK 1 T PO QD 06/15/2015: Received from: External Pharmacy   No facility-administered encounter medications on file as of 02/06/2023.    Surgical History: Past Surgical History:  Procedure Laterality Date   CHOLECYSTECTOMY     COLONOSCOPY WITH PROPOFOL N/A 04/20/2017   Procedure: COLONOSCOPY WITH PROPOFOL;  Surgeon: Midge Minium, MD;  Location: [facility];  Service: Endoscopy;  Laterality: N/A;  Diabetic - oral meds   HERNIA REPAIR      Medical History: Past Medical History:  Diagnosis Date   Diabetes mellitus, type 2 (HCC)    GERD (gastroesophageal reflux disease)    Hyperlipidemia    Hypertension    Sleep apnea     Family History: History reviewed. No pertinent family history.  Social History: Social History   Socioeconomic History   Marital status: Married    Spouse name: Not on file   Number of children: Not on file   Years of education: Not on file   Highest education level: Not on file  Occupational History   Not on file  Tobacco Use   Smoking status: Former    Current packs/day: 0.00    Types: Cigarettes    Quit date: 2007    Years since quitting: 17.7   Smokeless tobacco: Never  Vaping Use   Vaping status: Never Used  Substance and Sexual Activity   Alcohol use: Yes    Comment: rare - Holidays   Drug use: Not on file   Sexual activity: Not on  file  Other Topics Concern   Not on file  Social History Narrative   Not on file   Social Determinants of Health   Financial Resource Strain: Not on file  Food Insecurity: Not on file  Transportation Needs: Not on file  Physical Activity: Not on file  Stress: Not on file  Social Connections: Not on file  Intimate Partner Violence: Not on file    Vital Signs: Blood pressure (!) 121/90, pulse (!) 116, temperature 98.1 F (36.7 C), resp. rate 16, height 5'  10" (1.778 m), weight 250 lb 12.8 oz (113.8 kg), SpO2 96%.  Examination: General Appearance: The patient is well-developed, well-nourished, and in no distress. Skin: Gross inspection of skin unremarkable. Head: normocephalic, no gross deformities. Eyes: no gross deformities noted. ENT: ears appear grossly normal no exudates. Neck: Supple. No thyromegaly. No LAD. Respiratory: Lungs clear to auscultation. Cardiovascular: Normal S1 and S2 without murmur or rub. Extremities: No cyanosis. pulses are equal. Neurologic: Alert and oriented. No involuntary movements.  LABS: No results found for this or any previous visit (from the past 2160 hour(s)).  Radiology: US Abdomen Limited RUQ  Result Date: 01/18/2018 CLINICAL DATA:  Elevated LFTs EXAM: ULTRASOUND ABDOMEN LIMITED RIGHT UPPER QUADRANT COMPARISON:  None. FINDINGS: Gallbladder: Surgically removed Common bile duct: Diameter: 3.7 mm Liver: Diffuse increased echogenicity is noted consistent with fatty infiltration. No focal mass is noted. Portal vein is patent on color Doppler imaging with normal direction of blood flow towards the liver. IMPRESSION: Status post cholecystectomy. Fatty liver without acute abnormality. Electronically Signed   By: Alcide Clever M.D.   On: 01/18/2018 08:54    No results found.  No results found.    Assessment and Plan: Patient Active Problem List   Diagnosis Date Noted   Special screening for malignant neoplasms, colon    Essential (primary) hypertension 06/15/2015   Malignant melanoma of skin (HCC) 05/30/2014   Controlled type 2 diabetes mellitus without complication (HCC) 07/06/2013   Pure hypercholesterolemia 07/06/2013   Acid reflux 06/17/2011   Morbid obesity (HCC) 06/17/2011   Neurosis, posttraumatic 06/17/2011    1. Obstructive sleep apnea Continue excellent compliance - For home use only DME continuous positive airway pressure (CPAP)  2. CPAP use counseling CPAP couseling-Discussed  importance of adequate CPAP use as well as proper care and cleaning techniques of machine and all supplies.  3. Essential hypertension Continue current medication and f/u with PCP.  4. Obesity (BMI 30-39.9) Obesity Counseling: Had a lengthy discussion regarding patients BMI and weight issues. Patient was instructed on portion control as well as increased activity. Also discussed caloric restrictions with trying to maintain intake less than 2000 Kcal. Discussions were made in accordance with the 5As of weight management. Simple actions such as not eating late and if able to, taking a walk is suggested.    General Counseling: I have discussed the findings of the evaluation and examination with Fayrene Fearing.  I have also discussed any further diagnostic evaluation thatmay be needed or ordered today. Nimrod verbalizes understanding of the findings of todays visit. We also reviewed his medications today and discussed drug interactions and side effects including but not limited excessive drowsiness and altered mental states. We also discussed that there is always a risk not just to him but also people around him. he has been encouraged to call the office with any questions or concerns that should arise related to todays visit.  Orders Placed This Encounter  Procedures   For home use only  DME continuous positive airway pressure (CPAP)    Supplies needed    Order Specific Question:   Length of Need    Answer:   Lifetime    Order Specific Question:   Patient has OSA or probable OSA    Answer:   Yes    Order Specific Question:   Is the patient currently using CPAP in the home    Answer:   Yes    Order Specific Question:   Settings    Answer:   Other see comments    Order Specific Question:   CPAP supplies needed    Answer:   Mask, headgear, cushions, filters, heated tubing and water chamber     Time spent: 30  I have personally obtained a history, examined the patient, evaluated laboratory and imaging  results, formulated the assessment and plan and placed orders. This patient was seen by Lynn Ito, PA-C in collaboration with Dr. Freda Munro as a part of collaborative care agreement.     Yevonne Pax, MD [facility] Pulmonary and Critical Care Sleep medicine

## 2023-02-06 NOTE — Telephone Encounter (Signed)
 Notified Beth & Maralyn Sago of cpap supply order-Toni

## 2023-05-15 ENCOUNTER — Telehealth: Payer: Self-pay | Admitting: Internal Medicine

## 2023-05-15 NOTE — Telephone Encounter (Signed)
 Received call from patient's wife stating [employer] has been filing incorrect insurance and they are receiving high bills. I s/w Beth @ [employer]. She stated they have been filing [employer]. I faxed her insurance thru [facility]. She will give to billing department to resubmit claims. I notified patient's wife-Toni

## 2023-08-10 ENCOUNTER — Telehealth: Payer: Self-pay | Admitting: Physician Assistant

## 2023-08-10 ENCOUNTER — Encounter: Payer: Self-pay | Admitting: Physician Assistant

## 2023-08-10 ENCOUNTER — Ambulatory Visit (INDEPENDENT_AMBULATORY_CARE_PROVIDER_SITE_OTHER): Payer: PRIVATE HEALTH INSURANCE | Admitting: Physician Assistant

## 2023-08-10 VITALS — BP 132/88 | HR 108 | Temp 97.9°F | Resp 16 | Ht 70.0 in | Wt 249.0 lb

## 2023-08-10 DIAGNOSIS — Z7189 Other specified counseling: Secondary | ICD-10-CM

## 2023-08-10 DIAGNOSIS — G4733 Obstructive sleep apnea (adult) (pediatric): Secondary | ICD-10-CM

## 2023-08-10 DIAGNOSIS — E669 Obesity, unspecified: Secondary | ICD-10-CM

## 2023-08-10 NOTE — Progress Notes (Signed)
 [facility] [address] [place], [place] [address]  Pulmonary Sleep Medicine   Office Visit Note  Patient Name: Phillip Alexander DOB: 1965-07-08 MRN [id number]  Date of Service: 08/10/2023  Complaints/HPI: Pt is here for routine pulmonary follow up. Using cpap nightly and benefiting from use. Would like to try nasal mask with chin strap instead of FF due to positioning of neck sleeping on side with this. Also states he wakes with mask in mouth sometimes as if chin dropping below mask. Some centrals on download which could indicate a need for lower pressure, but feels he could use more pressure at times. Will try APAP 8-12 H2O.  CPAP compliance Download 07/11/23-08/09/23 Use 28/30 days >4 hours 90% Avg use 5 hrs Settings CPAP @ 10cm H2O AHI: 3.7, centrals 2.6 and obstructive 0.5 Leak 95th percentile 12.2  ROS  General: (-) fever, (-) chills, (-) night sweats, (-) weakness Skin: (-) rashes, (-) itching,. Eyes: (-) visual changes, (-) redness, (-) itching. Nose and Sinuses: (-) nasal stuffiness or itchiness, (-) postnasal drip, (-) nosebleeds, (-) sinus trouble. Mouth and Throat: (-) sore throat, (-) hoarseness. Neck: (-) swollen glands, (-) enlarged thyroid, (-) neck pain. Respiratory: - cough, (-) bloody sputum, - shortness of breath, - wheezing. Cardiovascular: - ankle swelling, (-) chest pain. Lymphatic: (-) lymph node enlargement. Neurologic: (-) numbness, (-) tingling. Psychiatric: (-) anxiety, (-) depression   Current Medication: Outpatient Encounter Medications as of 08/10/2023  Medication Sig Note   aspirin 81 MG tablet Take 81 mg by mouth daily.    atorvastatin (LIPITOR) 40 MG tablet TK 1 T PO QD 06/15/2015: Received from: External Pharmacy   lisinopril (PRINIVIL,ZESTRIL) 10 MG tablet  06/15/2015: Received from: External Pharmacy   metFORMIN (GLUCOPHAGE) 500 MG tablet TK 1 T PO BID WITH MEALS 06/15/2015: Received from: External Pharmacy   Multiple Vitamin  (MULTIVITAMIN) capsule Take by mouth. 06/15/2015: Received from: [facility]   pantoprazole (PROTONIX) 40 MG tablet TK 1 T PO  DAILY 06/15/2015: Received from: External Pharmacy   PARoxetine (PAXIL) 20 MG tablet TK 1 T PO QD 06/15/2015: Received from: External Pharmacy   No facility-administered encounter medications on file as of 08/10/2023.    Surgical History: Past Surgical History:  Procedure Laterality Date   CHOLECYSTECTOMY     COLONOSCOPY WITH PROPOFOL N/A 04/20/2017   Procedure: COLONOSCOPY WITH PROPOFOL;  Surgeon: Midge Minium, MD;  Location: [facility];  Service: Endoscopy;  Laterality: N/A;  Diabetic - oral meds   HERNIA REPAIR      Medical History: Past Medical History:  Diagnosis Date   Diabetes mellitus, type 2 (HCC)    GERD (gastroesophageal reflux disease)    Hyperlipidemia    Hypertension    Sleep apnea     Family History: History reviewed. No pertinent family history.  Social History: Social History   Socioeconomic History   Marital status: Married    Spouse name: Not on file   Number of children: Not on file   Years of education: Not on file   Highest education level: Not on file  Occupational History   Not on file  Tobacco Use   Smoking status: Former    Current packs/day: 0.00    Types: Cigarettes    Quit date: 2007    Years since quitting: 18.2   Smokeless tobacco: Never  Vaping Use   Vaping status: Never Used  Substance and Sexual Activity   Alcohol use: Yes    Comment: rare -  Holidays   Drug use: Not on file   Sexual activity: Not on file  Other Topics Concern   Not on file  Social History Narrative   Not on file   Social Drivers of Health   Financial Resource Strain: Low Risk  (04/20/2023)   Received from [facility]   Overall Financial Resource Strain (CARDIA)    Difficulty of Paying Living Expenses: Not very hard  Food Insecurity: No Food Insecurity (04/20/2023)   Received from [facility]   Hunger Vital Sign    Worried About Running Out of Food in the Last Year: Never true    Ran Out of Food in the Last Year: Never true  Transportation Needs: No Transportation Needs (04/20/2023)   Received from [facility] - Transportation    In the past 12 months, has lack of transportation kept you from medical appointments or from getting medications?: No    Lack of Transportation (Non-Medical): No  Physical Activity: Not on file  Stress: Not on file  Social Connections: Not on file  Intimate Partner Violence: Not on file    Vital Signs: Blood pressure 132/88, pulse (!) 108, temperature 97.9 F (36.6 C), resp. rate 16, height 5\' 10"  (1.778 m), weight 249 lb (112.9 kg), SpO2 97%.  Examination: General Appearance: The patient is well-developed, well-nourished, and in no distress. Skin: Gross inspection of skin unremarkable. Head: normocephalic, no gross deformities. Eyes: no gross deformities noted. ENT: ears appear grossly normal no exudates. Neck: Supple. No thyromegaly. No LAD. Respiratory: lungs clear to auscultation. Cardiovascular: Normal S1 and S2 without murmur or rub. Extremities: No cyanosis. pulses are equal. Neurologic: Alert and oriented. No involuntary movements.  LABS: No results found for this or any previous visit (from the past 2160 hours).  Radiology: US Abdomen Limited RUQ Result Date: 01/18/2018 CLINICAL DATA:  Elevated LFTs EXAM: ULTRASOUND ABDOMEN LIMITED RIGHT UPPER QUADRANT COMPARISON:  None. FINDINGS: Gallbladder: Surgically removed Common bile duct: Diameter: 3.7 mm Liver: Diffuse increased echogenicity is noted consistent with fatty infiltration. No focal mass is noted. Portal vein is patent on color Doppler imaging with normal direction of blood flow towards the liver. IMPRESSION: Status post cholecystectomy. Fatty liver without acute abnormality. Electronically Signed   By: Alcide Clever M.D.   On:  01/18/2018 08:54    No results found.  No results found.    Assessment and Plan: Patient Active Problem List   Diagnosis Date Noted   Special screening for malignant neoplasms, colon    Essential (primary) hypertension 06/15/2015   Malignant melanoma of skin (HCC) 05/30/2014   Controlled type 2 diabetes mellitus without complication (HCC) 07/06/2013   Pure hypercholesterolemia 07/06/2013   Acid reflux 06/17/2011   Morbid obesity (HCC) 06/17/2011   Neurosis, posttraumatic 06/17/2011    1. Obstructive sleep apnea (Primary) Continue excellent compliance, will adjust supplies and settings and follow up in 1 month after changes - For home use only DME continuous positive airway pressure (CPAP)  2. CPAP use counseling CPAP couseling-Discussed importance of adequate CPAP use as well as proper care and cleaning techniques of machine and all supplies.  3. Obesity (BMI 30-39.9) Obesity Counseling: Had a lengthy discussion regarding patients BMI and weight issues. Patient was instructed on portion control as well as increased activity. Also discussed caloric restrictions with trying to maintain intake less than 2000 Kcal. Discussions were made in accordance with the 5As of weight management. Simple actions such as not  eating late and if able to, taking a walk is suggested.    General Counseling: I have discussed the findings of the evaluation and examination with Fayrene Fearing.  I have also discussed any further diagnostic evaluation thatmay be needed or ordered today. Dawan verbalizes understanding of the findings of todays visit. We also reviewed his medications today and discussed drug interactions and side effects including but not limited excessive drowsiness and altered mental states. We also discussed that there is always a risk not just to him but also people around him. he has been encouraged to call the office with any questions or concerns that should arise related to todays  visit.  Orders Placed This Encounter  Procedures   For home use only DME continuous positive airway pressure (CPAP)    Needs supplies and would like to try Nasal mask with chin strap. [facility]. Also Change to APAP 8-12cm H2O.    Length of Need:   Lifetime    Patient has OSA or probable OSA:   Yes    Is the patient currently using CPAP in the home:   Yes    Settings:   Other see comments    CPAP supplies needed:   Mask, headgear, cushions, filters, heated tubing and water chamber    Additional equipment included:   Heated humification and supplies     Time spent: 30  I have personally obtained a history, examined the patient, evaluated laboratory and imaging results, formulated the assessment and plan and placed orders. This patient was seen by Lynn Ito, PA-C in collaboration with Dr. Freda Munro as a part of collaborative care agreement.     Yevonne Pax, MD [facility] Pulmonary and Critical Care Sleep medicine

## 2023-08-10 NOTE — Patient Instructions (Signed)

## 2023-08-10 NOTE — Telephone Encounter (Signed)
 Notified Beth & Sarah w/ [facility] of pressure change order-Toni

## 2023-09-14 ENCOUNTER — Encounter: Payer: Self-pay | Admitting: Physician Assistant

## 2023-09-14 ENCOUNTER — Ambulatory Visit (INDEPENDENT_AMBULATORY_CARE_PROVIDER_SITE_OTHER): Payer: PRIVATE HEALTH INSURANCE | Admitting: Physician Assistant

## 2023-09-14 ENCOUNTER — Telehealth: Payer: Self-pay | Admitting: Physician Assistant

## 2023-09-14 VITALS — BP 120/70 | HR 100 | Temp 97.8°F | Resp 16 | Ht 70.0 in | Wt 246.0 lb

## 2023-09-14 DIAGNOSIS — G4733 Obstructive sleep apnea (adult) (pediatric): Secondary | ICD-10-CM

## 2023-09-14 DIAGNOSIS — Z7189 Other specified counseling: Secondary | ICD-10-CM

## 2023-09-14 DIAGNOSIS — E669 Obesity, unspecified: Secondary | ICD-10-CM

## 2023-09-14 NOTE — Telephone Encounter (Signed)
 Notified [facility] Sarah w/ [facility] of cpap pressure change & supply order-Toni

## 2023-09-14 NOTE — Progress Notes (Signed)
 [facility] [address] [place], [place] [address]  Pulmonary Sleep Medicine   Office Visit Note  Patient Name: Phillip Alexander DOB: Nov 26, 1965 MRN [id number]  Date of Service: 09/14/2023  Complaints/HPI: Pt is here for routine pulmonary visit. Denies SOB or headaches. Some dryness. Never got nasal mask with chin strap option instead of FF mask as chin is dropping below mask and sleeping on side. Discussed FF may still be necessary if chin strap not enough to stop mouth venting, but he would like to try it still. States he is in collections with [facility] due to them billing insurance instead of processing through [facility] and is following up on this. Last visit changed from CPAP 10 to APAP 8-12 however AHI rising on this and will go back to 10. He states he did not notice a difference and is fine with going back to previous pressure.  CPAP compliance download 08/15/23-09/13/23 Use 30/30 days >4 hours 80% Avg use 4 hours 47 mins APAP 8-12cm H2O AHI 4.6, central 3.1, obstructive 0.7 Leak 95th 10.5  ROS  General: (-) fever, (-) chills, (-) night sweats, (-) weakness Skin: (-) rashes, (-) itching,. Eyes: (-) visual changes, (-) redness, (-) itching. Nose and Sinuses: (-) nasal stuffiness or itchiness, (-) postnasal drip, (-) nosebleeds, (-) sinus trouble. Mouth and Throat: (-) sore throat, (-) hoarseness. Neck: (-) swollen glands, (-) enlarged thyroid, (-) neck pain. Respiratory: - cough, (-) bloody sputum, - shortness of breath, - wheezing. Cardiovascular: - ankle swelling, (-) chest pain. Lymphatic: (-) lymph node enlargement. Neurologic: (-) numbness, (-) tingling. Psychiatric: (-) anxiety, (-) depression   Current Medication: Outpatient Encounter Medications as of 09/14/2023  Medication Sig Note   aspirin 81 MG tablet Take 81 mg by mouth daily.    atorvastatin (LIPITOR) 40 MG tablet TK 1 T PO QD 06/15/2015: Received from: External Pharmacy    lisinopril (PRINIVIL,ZESTRIL) 10 MG tablet  06/15/2015: Received from: External Pharmacy   metFORMIN (GLUCOPHAGE) 500 MG tablet TK 1 T PO BID WITH MEALS 06/15/2015: Received from: External Pharmacy   Multiple Vitamin (MULTIVITAMIN) capsule Take by mouth. 06/15/2015: Received from: [facility]   pantoprazole (PROTONIX) 40 MG tablet TK 1 T PO  DAILY 06/15/2015: Received from: External Pharmacy   PARoxetine (PAXIL) 20 MG tablet TK 1 T PO QD 06/15/2015: Received from: External Pharmacy   No facility-administered encounter medications on file as of 09/14/2023.    Surgical History: Past Surgical History:  Procedure Laterality Date   CHOLECYSTECTOMY     COLONOSCOPY WITH PROPOFOL  N/A 04/20/2017   Procedure: COLONOSCOPY WITH PROPOFOL ;  Surgeon: Marnee Sink, MD;  Location: [facility];  Service: Endoscopy;  Laterality: N/A;  Diabetic - oral meds   HERNIA REPAIR      Medical History: Past Medical History:  Diagnosis Date   Diabetes mellitus, type 2 (HCC)    GERD (gastroesophageal reflux disease)    Hyperlipidemia    Hypertension    Sleep apnea     Family History: History reviewed. No pertinent family history.  Social History: Social History   Socioeconomic History   Marital status: Married    Spouse name: Not on file   Number of children: Not on file   Years of education: Not on file   Highest education level: Not on file  Occupational History   Not on file  Tobacco Use   Smoking status: Former    Current packs/day: 0.00    Types: Cigarettes  Quit date: 2007    Years since quitting: 18.3   Smokeless tobacco: Never  Vaping Use   Vaping status: Never Used  Substance and Sexual Activity   Alcohol use: Yes    Comment: rare - Holidays   Drug use: Not on file   Sexual activity: Not on file  Other Topics Concern   Not on file  Social History Narrative   Not on file   Social Drivers of Health   Financial Resource Strain: Low Risk  (04/20/2023)    Received from [facility]   Overall Financial Resource Strain (CARDIA)    Difficulty of Paying Living Expenses: Not very hard  Food Insecurity: No Food Insecurity (04/20/2023)   Received from [facility]   Hunger Vital Sign    Worried About Running Out of Food in the Last Year: Never true    Ran Out of Food in the Last Year: Never true  Transportation Needs: No Transportation Needs (04/20/2023)   Received from [facility] - Transportation    In the past 12 months, has lack of transportation kept you from medical appointments or from getting medications?: No    Lack of Transportation (Non-Medical): No  Physical Activity: Not on file  Stress: Not on file  Social Connections: Not on file  Intimate Partner Violence: Not on file    Vital Signs: Blood pressure 120/70, pulse 100, temperature 97.8 F (36.6 C), resp. rate 16, height 5\' 10"  (1.778 m), weight 246 lb (111.6 kg), SpO2 97%.  Examination: General Appearance: The patient is well-developed, well-nourished, and in no distress. Skin: Gross inspection of skin unremarkable. Head: normocephalic, no gross deformities. Eyes: no gross deformities noted. ENT: ears appear grossly normal no exudates. Neck: Supple. No thyromegaly. No LAD. Respiratory: lungs clear to auscultation. Cardiovascular: Normal S1 and S2 without murmur or rub. Extremities: No cyanosis. pulses are equal. Neurologic: Alert and oriented. No involuntary movements.  LABS: No results found for this or any previous visit (from the past 2160 hours).  Radiology: US  Abdomen Limited RUQ Result Date: 01/18/2018 CLINICAL DATA:  Elevated LFTs EXAM: ULTRASOUND ABDOMEN LIMITED RIGHT UPPER QUADRANT COMPARISON:  None. FINDINGS: Gallbladder: Surgically removed Common bile duct: Diameter: 3.7 mm Liver: Diffuse increased echogenicity is noted consistent with fatty infiltration. No focal mass is noted. Portal vein is  patent on color Doppler imaging with normal direction of blood flow towards the liver. IMPRESSION: Status post cholecystectomy. Fatty liver without acute abnormality. Electronically Signed   By: Violeta Grey M.D.   On: 01/18/2018 08:54    No results found.  No results found.    Assessment and Plan: Patient Active Problem List   Diagnosis Date Noted   Special screening for malignant neoplasms, colon    Essential (primary) hypertension 06/15/2015   Malignant melanoma of skin (HCC) 05/30/2014   Controlled type 2 diabetes mellitus without complication (HCC) 07/06/2013   Pure hypercholesterolemia 07/06/2013   Acid reflux 06/17/2011   Morbid obesity (HCC) 06/17/2011   Neurosis, posttraumatic 06/17/2011    1. Obstructive sleep apnea (Primary) Continue nightly cpap use - For home use only DME continuous positive airway pressure (CPAP)  2. CPAP use counseling CPAP couseling-Discussed importance of adequate CPAP use as well as proper care and cleaning techniques of machine and all supplies.  3. Obesity (BMI 30-39.9) Obesity Counseling: Had a lengthy discussion regarding patients BMI and weight issues. Patient was instructed on portion control as well as increased activity. Also discussed  caloric restrictions with trying to maintain intake less than 2000 Kcal. Discussions were made in accordance with the 5As of weight management. Simple actions such as not eating late and if able to, taking a walk is suggested.    General Counseling: I have discussed the findings of the evaluation and examination with Phillip Alexander.  I have also discussed any further diagnostic evaluation thatmay be needed or ordered today. Phillip Alexander verbalizes understanding of the findings of todays visit. We also reviewed his medications today and discussed drug interactions and side effects including but not limited excessive drowsiness and altered mental states. We also discussed that there is always a risk not just to him but also  people around him. he has been encouraged to call the office with any questions or concerns that should arise related to todays visit.  Orders Placed This Encounter  Procedures   For home use only DME continuous positive airway pressure (CPAP)    Change pressure to CPAP @10cm  H2O. Needs nasal mask with chin strap. [facility]    Length of Need:   Lifetime    Patient has OSA or probable OSA:   Yes    Is the patient currently using CPAP in the home:   Yes    Settings:   Other see comments    CPAP supplies needed:   Mask, headgear, cushions, filters, heated tubing and water  chamber    Additional equipment included:   Heated humification and supplies     Time spent: 30  I have personally obtained a history, examined the patient, evaluated laboratory and imaging results, formulated the assessment and plan and placed orders. This patient was seen by Taylor Favia, PA-C in collaboration with Dr. Cam Cava as a part of collaborative care agreement.     Cordie Deters, MD [facility] Pulmonary and Critical Care Sleep medicine

## 2023-09-23 ENCOUNTER — Telehealth: Payer: Self-pay | Admitting: Physician Assistant

## 2023-09-23 NOTE — Telephone Encounter (Signed)
 Per Beth w/ [facility], pressure changed to 8-12 in [facility] on 09/15/23-Toni

## 2024-03-14 ENCOUNTER — Ambulatory Visit: Payer: PRIVATE HEALTH INSURANCE | Admitting: Physician Assistant

## 2024-03-21 ENCOUNTER — Ambulatory Visit (INDEPENDENT_AMBULATORY_CARE_PROVIDER_SITE_OTHER): Payer: PRIVATE HEALTH INSURANCE | Admitting: Internal Medicine

## 2024-03-21 ENCOUNTER — Encounter: Payer: Self-pay | Admitting: Internal Medicine

## 2024-03-21 VITALS — BP 135/88 | HR 101 | Temp 98.0°F | Resp 16 | Ht 70.0 in | Wt 249.0 lb

## 2024-03-21 DIAGNOSIS — E669 Obesity, unspecified: Secondary | ICD-10-CM

## 2024-03-21 DIAGNOSIS — G4733 Obstructive sleep apnea (adult) (pediatric): Secondary | ICD-10-CM | POA: Diagnosis not present

## 2024-03-21 DIAGNOSIS — Z7189 Other specified counseling: Secondary | ICD-10-CM

## 2024-03-21 NOTE — Progress Notes (Signed)
 [facility] [address] [place], [place] [address]  Pulmonary Sleep Medicine   Office Visit Note  Patient Name: Phillip Alexander DOB: September 01, 1965 MRN [id number]  Date of Service: 03/21/2024  Complaints/HPI: He states he had some congestion in the nose for about a week. He notes that it is better now. Denies having fevers. He states he did also have a cough. He works for [employer]. Patient thinks he may have had exposure to something in the field. Patient has been using the CPAP and states he is compliant. Sleeping better and less. He has not been able to lose weight unfortunately  Office Spirometry Results:     ROS  General: (-) fever, (-) chills, (-) night sweats, (-) weakness Skin: (-) rashes, (-) itching,. Eyes: (-) visual changes, (-) redness, (-) itching. Nose and Sinuses: (-) nasal stuffiness or itchiness, (-) postnasal drip, (-) nosebleeds, (-) sinus trouble. Mouth and Throat: (-) sore throat, (-) hoarseness. Neck: (-) swollen glands, (-) enlarged thyroid, (-) neck pain. Respiratory: - cough, (-) bloody sputum, - shortness of breath, - wheezing. Cardiovascular: - ankle swelling, (-) chest pain. Lymphatic: (-) lymph node enlargement. Neurologic: (-) numbness, (-) tingling. Psychiatric: (-) anxiety, (-) depression   Current Medication: Outpatient Encounter Medications as of 03/21/2024  Medication Sig Note   aspirin 81 MG tablet Take 81 mg by mouth daily.    atorvastatin (LIPITOR) 40 MG tablet TK 1 T PO QD 06/15/2015: Received from: External Pharmacy   lisinopril (PRINIVIL,ZESTRIL) 10 MG tablet  06/15/2015: Received from: External Pharmacy   metFORMIN (GLUCOPHAGE) 500 MG tablet TK 1 T PO BID WITH MEALS 06/15/2015: Received from: External Pharmacy   Multiple Vitamin (MULTIVITAMIN) capsule Take by mouth. 06/15/2015: Received from: [facility]   pantoprazole (PROTONIX) 40 MG tablet TK 1 T PO  DAILY 06/15/2015: Received from: External Pharmacy    PARoxetine (PAXIL) 20 MG tablet TK 1 T PO QD 06/15/2015: Received from: External Pharmacy   No facility-administered encounter medications on file as of 03/21/2024.    Surgical History: Past Surgical History:  Procedure Laterality Date   CHOLECYSTECTOMY     COLONOSCOPY WITH PROPOFOL  N/A 04/20/2017   Procedure: COLONOSCOPY WITH PROPOFOL ;  Surgeon: Jinny Carmine, MD;  Location: [facility];  Service: Endoscopy;  Laterality: N/A;  Diabetic - oral meds   HERNIA REPAIR      Medical History: Past Medical History:  Diagnosis Date   Diabetes mellitus, type 2 (HCC)    GERD (gastroesophageal reflux disease)    Hyperlipidemia    Hypertension    Sleep apnea     Family History: History reviewed. No pertinent family history.  Social History: Social History   Socioeconomic History   Marital status: Married    Spouse name: Not on file   Number of children: Not on file   Years of education: Not on file   Highest education level: Not on file  Occupational History   Not on file  Tobacco Use   Smoking status: Former    Current packs/day: 0.00    Types: Cigarettes    Quit date: 2007    Years since quitting: 18.8   Smokeless tobacco: Never  Vaping Use   Vaping status: Never Used  Substance and Sexual Activity   Alcohol use: Yes    Comment: rare - Holidays   Drug use: Not on file   Sexual activity: Not on file  Other Topics Concern   Not on file  Social History Narrative  Not on file   Social Drivers of Health   Financial Resource Strain: Low Risk  (04/20/2023)   Received from [facility]   Overall Financial Resource Strain (CARDIA)    Difficulty of Paying Living Expenses: Not very hard  Food Insecurity: No Food Insecurity (04/20/2023)   Received [facility]   Hunger Vital Sign    Within the past 12 months, you worried that your food would run out before you got the money to buy more.: Never true    Within the past 12  months, the food you bought just didn't last and you didn't have money to get more.: Never true  Transportation Needs: No Transportation Needs (04/20/2023)   Received from [facility]   PRAPARE - Transportation    Lack of Transportation (Non-Medical): No    In the past 12 months, has lack of transportation kept you from medical appointments or from getting medications?: No  Physical Activity: Not on file  Stress: Not on file  Social Connections: Not on file  Intimate Partner Violence: Not on file    Vital Signs: Blood pressure 135/88, pulse (!) 101, temperature 98 F (36.7 C), resp. rate 16, height 5' 10 (1.778 m), weight 249 lb (112.9 kg), SpO2 98%.  Examination: General Appearance: The patient is well-developed, well-nourished, and in no distress. Skin: Gross inspection of skin unremarkable. Head: normocephalic, no gross deformities. Eyes: no gross deformities noted. ENT: ears appear grossly normal no exudates. Neck: Supple. No thyromegaly. No LAD. Respiratory: no rhonchi noted. Cardiovascular: Normal S1 and S2 without murmur or rub. Extremities: No cyanosis. pulses are equal. Neurologic: Alert and oriented. No involuntary movements.  LABS: No results found for this or any previous visit (from the past 2160 hours).  Radiology: US  Abdomen Limited RUQ Result Date: 01/18/2018 CLINICAL DATA:  Elevated LFTs EXAM: ULTRASOUND ABDOMEN LIMITED RIGHT UPPER QUADRANT COMPARISON:  None. FINDINGS: Gallbladder: Surgically removed Common bile duct: Diameter: 3.7 mm Liver: Diffuse increased echogenicity is noted consistent with fatty infiltration. No focal mass is noted. Portal vein is patent on color Doppler imaging with normal direction of blood flow towards the liver. IMPRESSION: Status post cholecystectomy. Fatty liver without acute abnormality. Electronically Signed   By: Oneil Devonshire M.D.   On: 01/18/2018 08:54    No results found.  No results found.  Assessment and  Plan: Patient Active Problem List   Diagnosis Date Noted   Special screening for malignant neoplasms, colon    Essential (primary) hypertension 06/15/2015   Malignant melanoma of skin (HCC) 05/30/2014   Controlled type 2 diabetes mellitus without complication (HCC) 07/06/2013   Pure hypercholesterolemia 07/06/2013   Acid reflux 06/17/2011   Morbid obesity (HCC) 06/17/2011   Neurosis, posttraumatic 06/17/2011    1. Obstructive sleep apnea (Primary) He is doing well overall. He has excellent compliance. I spoke to him about using a nasal steroid to help with congestion as needed. Download shows an AHI of 2.8 per hour  2. CPAP use counseling CPAP Counseling: had a lengthy discussion with the patient regarding the importance of PAP therapy in management of the sleep apnea. Patient appears to understand the risk factor reduction and also understands the risks associated with untreated sleep apnea.   3. Obesity (BMI 30-39.9)  Obesity Counseling: Had a lengthy discussion regarding patients BMI and weight issues. Patient was instructed on portion control as well as increased activity. Also discussed caloric restrictions with trying to maintain intake less than 2000 Kcal.  General Counseling: I have discussed the findings of the evaluation and examination with [place].  I have also discussed any further diagnostic evaluation thatmay be needed or ordered today. Raheen verbalizes understanding of the findings of todays visit. We also reviewed his medications today and discussed drug interactions and side effects including but not limited excessive drowsiness and altered mental states. We also discussed that there is always a risk not just to him but also people around him. he has been encouraged to call the office with any questions or concerns that should arise related to todays visit.  No orders of the defined types were placed in this encounter.    Time spent: 58  I have personally obtained a  history, examined the patient, evaluated laboratory and imaging results, formulated the assessment and plan and placed orders.    Elfreda DELENA Bathe, MD [facility] Sleep medicine

## 2024-03-21 NOTE — Patient Instructions (Signed)

## 2025-03-20 ENCOUNTER — Ambulatory Visit: Payer: PRIVATE HEALTH INSURANCE | Admitting: Internal Medicine
